# Patient Record
Sex: Female | Born: 1975
Health system: Southern US, Community
[De-identification: ages and names within clinical notes are randomized; demographics above are authoritative.]

## PROBLEM LIST (undated history)

## (undated) DIAGNOSIS — Z9889 Other specified postprocedural states: Secondary | ICD-10-CM

## (undated) DIAGNOSIS — F32A Depression, unspecified: Secondary | ICD-10-CM

## (undated) DIAGNOSIS — N201 Calculus of ureter: Secondary | ICD-10-CM

## (undated) DIAGNOSIS — N926 Irregular menstruation, unspecified: Secondary | ICD-10-CM

## (undated) DIAGNOSIS — F329 Major depressive disorder, single episode, unspecified: Secondary | ICD-10-CM

## (undated) DIAGNOSIS — R059 Cough, unspecified: Secondary | ICD-10-CM

## (undated) DIAGNOSIS — R112 Nausea with vomiting, unspecified: Secondary | ICD-10-CM

## (undated) DIAGNOSIS — Z87442 Personal history of urinary calculi: Secondary | ICD-10-CM

## (undated) DIAGNOSIS — R05 Cough: Secondary | ICD-10-CM

## (undated) DIAGNOSIS — K589 Irritable bowel syndrome without diarrhea: Secondary | ICD-10-CM

## (undated) DIAGNOSIS — K219 Gastro-esophageal reflux disease without esophagitis: Secondary | ICD-10-CM

## (undated) HISTORY — PX: STRABISMUS SURGERY: SHX218

## (undated) HISTORY — DX: Depression, unspecified: F32.A

## (undated) HISTORY — DX: Major depressive disorder, single episode, unspecified: F32.9

---

## 1996-01-29 HISTORY — PX: CHOLECYSTECTOMY: SHX55

## 1996-01-29 HISTORY — PX: OTHER SURGICAL HISTORY: SHX169

## 2002-04-09 ENCOUNTER — Other Ambulatory Visit: Admission: RE | Admit: 2002-04-09 | Discharge: 2002-04-09 | Payer: Self-pay | Admitting: Obstetrics and Gynecology

## 2002-07-14 ENCOUNTER — Emergency Department (HOSPITAL_COMMUNITY): Admission: EM | Admit: 2002-07-14 | Discharge: 2002-07-14 | Payer: Self-pay | Admitting: Emergency Medicine

## 2004-11-28 ENCOUNTER — Other Ambulatory Visit: Admission: RE | Admit: 2004-11-28 | Discharge: 2004-11-28 | Payer: Self-pay | Admitting: Obstetrics and Gynecology

## 2005-06-03 ENCOUNTER — Encounter: Admission: RE | Admit: 2005-06-03 | Discharge: 2005-06-03 | Payer: Self-pay | Admitting: Obstetrics and Gynecology

## 2005-08-11 ENCOUNTER — Inpatient Hospital Stay (HOSPITAL_COMMUNITY): Admission: AD | Admit: 2005-08-11 | Discharge: 2005-08-13 | Payer: Self-pay | Admitting: Obstetrics and Gynecology

## 2005-09-28 HISTORY — PX: ESSURE TUBAL LIGATION: SUR464

## 2006-01-14 ENCOUNTER — Ambulatory Visit (HOSPITAL_COMMUNITY): Admission: RE | Admit: 2006-01-14 | Discharge: 2006-01-14 | Payer: Self-pay | Admitting: Obstetrics and Gynecology

## 2006-04-03 ENCOUNTER — Ambulatory Visit: Payer: Self-pay | Admitting: Family Medicine

## 2011-05-13 ENCOUNTER — Emergency Department (HOSPITAL_BASED_OUTPATIENT_CLINIC_OR_DEPARTMENT_OTHER)
Admission: EM | Admit: 2011-05-13 | Discharge: 2011-05-13 | Disposition: A | Discharge status: Home / Self Care | Payer: 59 | Attending: Emergency Medicine | Admitting: Emergency Medicine

## 2011-05-13 ENCOUNTER — Emergency Department (INDEPENDENT_AMBULATORY_CARE_PROVIDER_SITE_OTHER): Payer: 59

## 2011-05-13 ENCOUNTER — Encounter (HOSPITAL_BASED_OUTPATIENT_CLINIC_OR_DEPARTMENT_OTHER): Payer: Self-pay | Admitting: *Deleted

## 2011-05-13 DIAGNOSIS — N1330 Unspecified hydronephrosis: Secondary | ICD-10-CM

## 2011-05-13 DIAGNOSIS — N201 Calculus of ureter: Secondary | ICD-10-CM

## 2011-05-13 DIAGNOSIS — R109 Unspecified abdominal pain: Secondary | ICD-10-CM | POA: Insufficient documentation

## 2011-05-13 DIAGNOSIS — N132 Hydronephrosis with renal and ureteral calculous obstruction: Secondary | ICD-10-CM

## 2011-05-13 HISTORY — DX: Irritable bowel syndrome, unspecified: K58.9

## 2011-05-13 LAB — URINALYSIS, ROUTINE W REFLEX MICROSCOPIC
Ketones, ur: NEGATIVE mg/dL
Specific Gravity, Urine: 1.022 (ref 1.005–1.030)
Urobilinogen, UA: 0.2 mg/dL (ref 0.0–1.0)
pH: 6.5 (ref 5.0–8.0)

## 2011-05-13 LAB — URINE MICROSCOPIC-ADD ON

## 2011-05-13 MED ORDER — ONDANSETRON HCL 4 MG/2ML IJ SOLN
4.0000 mg | Freq: Once | INTRAMUSCULAR | Status: AC
  Administered 2011-05-13: 4 mg via INTRAVENOUS
  Filled 2011-05-13: qty 2

## 2011-05-13 MED ORDER — NAPROXEN SODIUM 220 MG PO TABS: ORAL_TABLET | ORAL | Status: DC

## 2011-05-13 MED ORDER — FENTANYL CITRATE 0.05 MG/ML IJ SOLN
50.0000 ug | Freq: Once | INTRAMUSCULAR | Status: AC
  Administered 2011-05-13: 50 ug via INTRAVENOUS
  Filled 2011-05-13: qty 2

## 2011-05-13 MED ORDER — SODIUM CHLORIDE 0.9 % IV SOLN
INTRAVENOUS | Status: DC
  Administered 2011-05-13: 04:00:00 via INTRAVENOUS

## 2011-05-13 MED ORDER — ONDANSETRON 8 MG PO TBDP: 8.0000 mg | ORAL_TABLET | Freq: Three times a day (TID) | ORAL | Status: AC | PRN

## 2011-05-13 MED ORDER — HYDROMORPHONE HCL 2 MG PO TABS: 2.0000 mg | ORAL_TABLET | ORAL | Status: AC | PRN

## 2011-05-13 MED ORDER — KETOROLAC TROMETHAMINE 15 MG/ML IJ SOLN
15.0000 mg | Freq: Once | INTRAMUSCULAR | Status: AC
  Administered 2011-05-13: 15 mg via INTRAVENOUS
  Filled 2011-05-13: qty 1

## 2011-05-13 NOTE — ED Notes (Signed)
Pt reports RLQ pain since 1730 yesterday with nausea

## 2011-05-13 NOTE — ED Provider Notes (Signed)
History     CSN: [id number]  Arrival date & time 05/13/11  0207   First MD Initiated Contact with Patient 05/13/11 0250      Chief Complaint  Patient presents with  . Right flank pain     (Consider location/radiation/quality/duration/timing/severity/associated sxs/prior treatment) HPI This is a 36 year old white female with a history of irritable bowel syndrome and remote history of kidney stones. She is here with right flank pain that began yesterday evening. It radiates to the right lower quadrant. It is moderate in severity. It has not been relieved by taking hyoscyamine. The pain is moderate. She cannot find a comfortable position. She's had nausea but no vomiting. She's not aware of any hematuria or dysuria. She is having vaginal spotting right now.  Past Medical History  Diagnosis Date  . IBS (irritable bowel syndrome)   . Kidney stones     Past Surgical History  Procedure Date  . Cholecystectomy   . Lithotripsy   . Essure tubal ligation     No family history on file.  History  Substance Use Topics  . Smoking status: Never Smoker   . Smokeless tobacco: Never Used  . Alcohol Use: Not on file    OB History    Grav Para Term Preterm Abortions TAB SAB Ect Mult Living                  Review of Systems  All other systems reviewed and are negative.    Allergies  Penicillins  Home Medications   Current Outpatient Rx  Name Route Sig Dispense Refill  . EASY-LAX PO Oral Take by mouth.    Marland Kitchen HYOSCYAMINE SULFATE 0.125 MG PO TABS Oral Take 0.125 mg by mouth every 4 (four) hours as needed.      BP 113/68  Pulse 79  Temp(Src) 98.6 F (37 C) (Oral)  Resp 18  SpO2 98%  LMP 05/11/2011  Physical Exam General: Well-developed, well-nourished female in no acute distress; appearance consistent with age of record HENT: normocephalic, atraumatic Eyes: pupils equal round and reactive to light; extraocular muscles intact Neck: supple Heart: regular rate and  rhythm Lungs: clear to auscultation bilaterally Abdomen: soft; nondistended; nontender; no masses or hepatosplenomegaly; bowel sounds present GU: Mild right flank tenderness Extremities: No deformity; full range of motion Neurologic: Awake, alert and oriented; motor function intact in all extremities and symmetric; no facial droop Skin: Warm and dry Psychiatric: Mildly agitated    ED Course  Procedures (including critical care time)     MDM   Nursing notes and vitals signs, including pulse oximetry, reviewed.  Summary of this visit's results, reviewed by myself:  Labs:  Results for orders placed during the hospital encounter of 05/13/11  URINALYSIS, ROUTINE W REFLEX MICROSCOPIC      Component Value Range   Color, Urine YELLOW  YELLOW    APPearance CLEAR  CLEAR    Specific Gravity, Urine 1.022  1.005 - 1.030    pH 6.5  5.0 - 8.0    Glucose, UA NEGATIVE  NEGATIVE (mg/dL)   Hgb urine dipstick LARGE (*) NEGATIVE    Bilirubin Urine NEGATIVE  NEGATIVE    Ketones, ur NEGATIVE  NEGATIVE (mg/dL)   Protein, ur NEGATIVE  NEGATIVE (mg/dL)   Urobilinogen, UA 0.2  0.0 - 1.0 (mg/dL)   Nitrite NEGATIVE  NEGATIVE    Leukocytes, UA NEGATIVE  NEGATIVE   PREGNANCY, URINE      Component Value Range   Preg Test, Ur NEGATIVE  NEGATIVE   URINE MICROSCOPIC-ADD ON      Component Value Range   Squamous Epithelial / LPF RARE  RARE    WBC, UA 0-2  <3 (WBC/hpf)   RBC / HPF 11-20  <3 (RBC/hpf)   Bacteria, UA FEW (*) RARE    Urine-Other MUCOUS PRESENT      Imaging Studies: Ct Abdomen Pelvis Wo Contrast  05/13/2011  *RADIOLOGY REPORT*  Clinical Data: Right flank pain; history of renal stones.  CT ABDOMEN AND PELVIS WITHOUT CONTRAST  Technique:  Multidetector CT imaging of the abdomen and pelvis was performed following the standard protocol without intravenous contrast.  Comparison: None.  Findings: The visualized lung bases are clear.  The liver and spleen are unremarkable in appearance.  The  patient is status post cholecystectomy, with clips noted along the gallbladder fossa.  The pancreas and adrenal glands are unremarkable.  There is relatively severe right-sided hydronephrosis, with right- sided perinephric stranding and fluid, and prominence of the right ureter to the level of an obstructing 1.0 x 0.7 cm stone at the distal right ureter, perhaps 6 cm proximal to the right vesicoureteral junction.  An adjacent smaller 0.3 cm stone is noted more proximally.  The left kidney is unremarkable in appearance.  No nonobstructing renal stones are identified.  No free fluid is identified.  The small bowel is unremarkable in appearance.  The stomach is within normal limits.  No acute vascular abnormalities are seen.  The appendix is not definitely seen; there is no evidence for appendicitis.  The cecum is flipped anteriorly.  The colon is grossly unremarkable in appearance.  The bladder is mildly distended and grossly unremarkable in appearance.  The uterus is grossly unremarkable.  The patient's Essure occlusion device has migrated centrally on the left side, but is noted in grossly appropriate position on the right.  The ovaries are grossly unremarkable in appearance; no suspicious adnexal masses are seen.  No inguinal lymphadenopathy is seen.  No acute osseous abnormalities are identified.  IMPRESSION:  1.  Relatively severe right-sided hydronephrosis, with right-sided perinephric stranding and fluid, and an obstructing 1.0 x 0.7 cm stone noted distally in the right ureter, 6 cm proximal to the right vesicoureteral junction.  Adjacent smaller 0.3 cm stone noted more proximally in the right ureter. 2.  Essure occlusion device has migrated centrally on the left side into the uterus, though still seen in grossly appropriate position on the right.  Suggest clinical correlation.  Original Report Authenticated By: Tonia Ghent, M.D.    5:05 AM Patient's pain controlled with IV medications. Patient was  advised of stones seen on CT. She was also advised of the findings regarding her Essure occlusion device. She was advised to followup with her OB/GYN regarding the Essure device. She was advised to call [facility] today and arrange prompt followup. She was advised that surgical intervention is likely required in this case.       Hanley Seamen, MD 05/13/11 [phone]

## 2011-05-13 NOTE — Discharge Instructions (Signed)
Ureteral Colic (Kidney Stones) Ureteral colic is the result of a condition when kidney stones form inside the kidney. Once kidney stones are formed they may move into the tube that connects the kidney with the bladder (ureter). If this occurs, this condition may cause pain (colic) in the ureter.  CAUSES  Pain is caused by stone movement in the ureter and the obstruction caused by the stone. SYMPTOMS  The pain comes and goes as the ureter contracts around the stone. The pain is usually intense, sharp, and stabbing in character. The location of the pain may move as the stone moves through the ureter. When the stone is near the kidney the pain is usually located in the back and radiates to the belly (abdomen). When the stone is ready to pass into the bladder the pain is often located in the lower abdomen on the side the stone is located. At this location, the symptoms may mimic those of a urinary tract infection with urinary frequency. Once the stone is located here it often passes into the bladder and the pain disappears completely. TREATMENT   Your caregiver will provide you with medicine for pain relief.   You may require specialized follow-up X-rays.   The absence of pain does not always mean that the stone has passed. It may have just stopped moving. If the urine remains completely obstructed, it can cause loss of kidney function or even complete destruction of the involved kidney. It is your responsibility and in your interest that X-rays and follow-ups as suggested by your caregiver are completed. Relief of pain without passage of the stone can be associated with severe damage to the kidney, including loss of kidney function on that side.   If your stone does not pass on its own, additional measures may be taken by your caregiver to ensure its removal.  HOME CARE INSTRUCTIONS   Increase your fluid intake. Water is the preferred fluid since juices containing vitamin C may acidify the urine making  it less likely for certain stones (uric acid stones) to pass.   Strain all urine. A strainer will be provided. Keep all particulate matter or stones for your caregiver to inspect.   Take your pain medicine as directed.   Make a follow-up appointment with your caregiver as directed.   Remember that the goal is passage of your stone. The absence of pain does not mean the stone is gone. Follow your caregiver's instructions.   Only take over-the-counter or prescription medicines for pain, discomfort, or fever as directed by your caregiver.  SEEK MEDICAL CARE IF:   Pain cannot be controlled with the prescribed medicine.   You have a fever.   Pain continues for longer than your caregiver advises it should.   There is a change in the pain, and you develop chest discomfort or constant abdominal pain.   You feel faint or pass out.  MAKE SURE YOU:   Understand these instructions.   Will watch your condition.   Will get help right away if you are not doing well or get worse.  Document Released: 10/24/2004 Document Revised: 01/03/2011 Document Reviewed: 07/11/2010 [employer] Patient Information 2012 [employer]. 

## 2011-05-14 ENCOUNTER — Other Ambulatory Visit: Payer: Self-pay | Admitting: Urology

## 2011-05-16 ENCOUNTER — Encounter (HOSPITAL_BASED_OUTPATIENT_CLINIC_OR_DEPARTMENT_OTHER): Payer: Self-pay | Admitting: *Deleted

## 2011-05-16 NOTE — Progress Notes (Signed)
NPO AFTER MN. ARRIVES AT 0915. NEEDS HG. MAY TAKE PRN MEDS (NO ALEVE), IF NEEDED W/ SIPS OF WATER AM OF SURG.

## 2011-05-17 NOTE — H&P (Signed)
Ms. Kimberly Riley presents today as a referral from [facility] for a new diagnosis of right-sided ureteral calculi. Ms. Kimberly Riley is 36 years of age. She reports an episode of nephrolithiasis about 15 years ago. Apparently she had multiple stones at that time and had a procedure that sounds like a probable ureteroscopy. She has had no recurrences. Approximately two days ago she developed some right-sided abdominal pain which she first thought was her irritable syndrome. The pain persisted. She was seen in the emergency room. There she had a urinalysis which did show microhematuria. Urine pH was 6.5. The patient had a CT scan which showed right-sided hydronephrosis. She had an obstructing 10 x 7 mm stone in her distal right ureter. They thought there was an adjacent smaller 3 mm stone just proximal to that. The left kidney was unremarkable. There were no other stones identified.       Past Medical History Problems  1. History of  Depression 311 2. History of  Irritable Bowel Syndrome 564.1  Surgical History Problems  1. History of  Cholecystectomy 2. History of  Eye Surgery 3. History of  Foot Surgery  Current Meds 1. Hyoscyamine Sulfate 0.125 MG Oral Tablet; Therapy: (Recorded:16Apr2013) to  Allergies Medication  1. Penicillins  Family History Problems  1. Paternal history of  Family Health Status - Father's Age 14yrs 2. Maternal history of  Family Health Status - Mother's Age 55yrs 3. Family history of  Family Health Status Number Of Children 2 sons 4. 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  Gastrointestinal: nausea, flank pain, abdominal pain and diarrhea.  Constitutional: feeling tired (fatigue).  Psychiatric: depression.    Vitals Vital Signs [Data Includes: Last 1 Day]  16Apr2013 03:01PM  BMI Calculated: 34.15 BSA Calculated: 1.96 Height: 5 ft 4 in Weight: 200 lb  Blood Pressure: 109 / 76 Temperature: 98.1 F Heart Rate: 107  Physical Exam Constitutional: Well nourished and well developed . No acute distress.  ENT:. The ears and nose are normal in appearance.  Neck: The appearance of the neck is normal and no neck mass is present . There is no jugular-venous distention.  Pulmonary: No respiratory distress and normal respiratory rhythm and effort.  Cardiovascular: Heart rate and rhythm are normal . No peripheral edema.  Abdomen: The abdomen is soft and nontender. No masses are palpated. No CVA tenderness.  Skin: Normal skin turgor, no visible rash and no visible skin lesions.  Neuro/Psych:. Mood and affect are appropriate.    Results/Data Urine [Data Includes: Last 1 Day]   16Apr2013  COLOR AMBER   APPEARANCE CLEAR   SPECIFIC GRAVITY 1.030   pH 6.0   GLUCOSE NEG mg/dL  BILIRUBIN SMALL   KETONE 15 mg/dL  BLOOD LARGE   PROTEIN 30 mg/dL  UROBILINOGEN 0.2 mg/dL  NITRITE NEG   LEUKOCYTE ESTERASE TRACE   SQUAMOUS EPITHELIAL/HPF FEW   WBC 7-10 WBC/hpf  RBC 21-50 RBC/hpf  BACTERIA FEW   CRYSTALS NONE SEEN   CASTS NONE SEEN   Other SM. AMT. MUCUS OBS.     KUB was obtained. This does  demonstrate a very suspicious 10 x 7 mm calcification in the right hemi-pelvis just past the pelvic brim. There is an additional 3 mm calcification as well. The patient has Essure____ devices bilaterally. They are somewhat asymmetric and apparently there was some question as to whether one is malpositioned or not. I do not see any evidence of any renal calculi.    Assessment Assessed  1. Ureteral Stone  592.1 2. Pyuria 791.9  Plan Health Maintenance (V70.0)  1. UA With REFLEX  Done: 16Apr2013 02:38PM  Discussion/Summary   Ms. Kimberly Riley has a very large 7 x 10 mm stone in her right distal ureter approximately 5-6 cm above her right ureterovesical junction. Clinically she is doing okay and there is no urgent need for intervention. If she had to have anything done more urgently it would be just a double-J stent and I would rather try to get her on the schedule for more definitive management. Lithotripsy is an option but I do think she would be better served with ureteroscopy. She has had that procedure in the past. We would require Holmium laser lithotripsy and a high likelihood of a double-J stent at least for 4-5 days. This would, however, have the best chance of success and that procedure, recovery risks, benefits are discussed with her today. She will need to check with her OB/GYN about her birth control device and whether any additional assessment needs to be done with regard to that.

## 2011-05-20 ENCOUNTER — Encounter (HOSPITAL_BASED_OUTPATIENT_CLINIC_OR_DEPARTMENT_OTHER): Payer: Self-pay | Admitting: *Deleted

## 2011-05-20 ENCOUNTER — Encounter (HOSPITAL_BASED_OUTPATIENT_CLINIC_OR_DEPARTMENT_OTHER): Payer: Self-pay | Admitting: Anesthesiology

## 2011-05-20 ENCOUNTER — Ambulatory Visit (HOSPITAL_BASED_OUTPATIENT_CLINIC_OR_DEPARTMENT_OTHER)
Admission: RE | Admit: 2011-05-20 | Discharge: 2011-05-20 | Disposition: A | Discharge status: Home / Self Care | Payer: 59 | Source: Ambulatory Visit | Attending: Urology | Admitting: Urology

## 2011-05-20 ENCOUNTER — Encounter (HOSPITAL_BASED_OUTPATIENT_CLINIC_OR_DEPARTMENT_OTHER)
Admission: RE | Disposition: A | Discharge status: Home / Self Care | Payer: Self-pay | Source: Ambulatory Visit | Attending: Urology

## 2011-05-20 ENCOUNTER — Ambulatory Visit (HOSPITAL_BASED_OUTPATIENT_CLINIC_OR_DEPARTMENT_OTHER): Payer: 59 | Admitting: Anesthesiology

## 2011-05-20 DIAGNOSIS — Z79899 Other long term (current) drug therapy: Secondary | ICD-10-CM | POA: Insufficient documentation

## 2011-05-20 DIAGNOSIS — R82998 Other abnormal findings in urine: Secondary | ICD-10-CM | POA: Insufficient documentation

## 2011-05-20 DIAGNOSIS — N201 Calculus of ureter: Secondary | ICD-10-CM | POA: Insufficient documentation

## 2011-05-20 DIAGNOSIS — K589 Irritable bowel syndrome without diarrhea: Secondary | ICD-10-CM | POA: Insufficient documentation

## 2011-05-20 HISTORY — DX: Personal history of urinary calculi: Z87.442

## 2011-05-20 HISTORY — PX: OTHER SURGICAL HISTORY: SHX169

## 2011-05-20 SURGERY — CYSTOURETEROSCOPY, WITH RETROGRADE PYELOGRAM AND STENT INSERTION
Anesthesia: General | Site: Ureter | Wound class: Clean Contaminated

## 2011-05-20 MED ORDER — DEXAMETHASONE SODIUM PHOSPHATE 4 MG/ML IJ SOLN
INTRAMUSCULAR | Status: DC | PRN
  Administered 2011-05-20: 10 mg via INTRAVENOUS

## 2011-05-20 MED ORDER — URELLE 81 MG PO TABS
1.0000 | ORAL_TABLET | Freq: Once | ORAL | Status: AC
  Administered 2011-05-20: 81 mg via ORAL

## 2011-05-20 MED ORDER — PROPOFOL 10 MG/ML IV EMUL
INTRAVENOUS | Status: DC | PRN
  Administered 2011-05-20: 200 mg via INTRAVENOUS

## 2011-05-20 MED ORDER — LACTATED RINGERS IV SOLN: INTRAVENOUS | Status: DC

## 2011-05-20 MED ORDER — ONDANSETRON HCL 4 MG/2ML IJ SOLN
INTRAMUSCULAR | Status: DC | PRN
  Administered 2011-05-20: 4 mg via INTRAVENOUS

## 2011-05-20 MED ORDER — HYDROMORPHONE HCL 2 MG PO TABS
2.0000 mg | ORAL_TABLET | Freq: Once | ORAL | Status: AC
  Administered 2011-05-20: 2 mg via ORAL

## 2011-05-20 MED ORDER — CIPROFLOXACIN IN D5W 400 MG/200ML IV SOLN
400.0000 mg | INTRAVENOUS | Status: AC
  Administered 2011-05-20: 400 mg via INTRAVENOUS

## 2011-05-20 MED ORDER — FENTANYL CITRATE 0.05 MG/ML IJ SOLN
INTRAMUSCULAR | Status: DC | PRN
  Administered 2011-05-20: 50 ug via INTRAVENOUS
  Administered 2011-05-20 (×4): 25 ug via INTRAVENOUS
  Administered 2011-05-20: 50 ug via INTRAVENOUS

## 2011-05-20 MED ORDER — SODIUM CHLORIDE 0.9 % IR SOLN
Status: DC | PRN
  Administered 2011-05-20: 6000 mL

## 2011-05-20 MED ORDER — LIDOCAINE HCL (CARDIAC) 20 MG/ML IV SOLN
INTRAVENOUS | Status: DC | PRN
  Administered 2011-05-20: 50 mg via INTRAVENOUS

## 2011-05-20 MED ORDER — BELLADONNA ALKALOIDS-OPIUM 16.2-60 MG RE SUPP
RECTAL | Status: DC | PRN
  Administered 2011-05-20: 1 via RECTAL

## 2011-05-20 MED ORDER — LIDOCAINE HCL 2 % EX GEL
CUTANEOUS | Status: DC | PRN
  Administered 2011-05-20: 1 via URETHRAL

## 2011-05-20 MED ORDER — LACTATED RINGERS IV SOLN
INTRAVENOUS | Status: DC
  Administered 2011-05-20 (×3): via INTRAVENOUS

## 2011-05-20 MED ORDER — PROMETHAZINE HCL 25 MG/ML IJ SOLN: 6.2500 mg | INTRAMUSCULAR | Status: DC | PRN

## 2011-05-20 MED ORDER — URIBEL 118 MG PO CAPS: 1.0000 | ORAL_CAPSULE | Freq: Three times a day (TID) | ORAL | Status: DC | PRN

## 2011-05-20 MED ORDER — FENTANYL CITRATE 0.05 MG/ML IJ SOLN: 25.0000 ug | INTRAMUSCULAR | Status: DC | PRN

## 2011-05-20 MED ORDER — MIDAZOLAM HCL 5 MG/5ML IJ SOLN
INTRAMUSCULAR | Status: DC | PRN
  Administered 2011-05-20: 1 mg via INTRAVENOUS

## 2011-05-20 MED ORDER — NITROFURANTOIN MONOHYD MACRO 100 MG PO CAPS: 100.0000 mg | ORAL_CAPSULE | Freq: Every day | ORAL | Status: AC

## 2011-05-20 MED ORDER — IOHEXOL 350 MG/ML SOLN
INTRAVENOUS | Status: DC | PRN
  Administered 2011-05-20: 50 mL via INTRAVENOUS

## 2011-05-20 SURGICAL SUPPLY — 40 items
ADAPTER CATH URET PLST 4-6FR (CATHETERS) IMPLANT
ADPR CATH URET STRL DISP 4-6FR (CATHETERS)
APL SKNCLS STERI-STRIP NONHPOA (GAUZE/BANDAGES/DRESSINGS) ×1
BAG DRAIN URO-CYSTO SKYTR STRL (DRAIN) ×2 IMPLANT
BAG DRN UROCATH (DRAIN) ×1
BASKET LASER NITINOL 1.9FR (BASKET) IMPLANT
BASKET STNLS GEMINI 4WIRE 3FR (BASKET) IMPLANT
BASKET ZERO TIP NITINOL 2.4FR (BASKET) ×3 IMPLANT
BENZOIN TINCTURE PRP APPL 2/3 (GAUZE/BANDAGES/DRESSINGS) ×1 IMPLANT
BSKT STON RTRVL 120 1.9FR (BASKET)
BSKT STON RTRVL GEM 120X11 3FR (BASKET)
BSKT STON RTRVL ZERO TP 2.4FR (BASKET) ×2
CANISTER SUCT LVC 12 LTR MEDI- (MISCELLANEOUS) IMPLANT
CATH INTERMIT  6FR 70CM (CATHETERS) ×1 IMPLANT
CATH URET 5FR 28IN CONE TIP (BALLOONS)
CATH URET 5FR 28IN OPEN ENDED (CATHETERS) IMPLANT
CATH URET 5FR 70CM CONE TIP (BALLOONS) IMPLANT
CLOTH BEACON ORANGE TIMEOUT ST (SAFETY) ×2 IMPLANT
DRAPE CAMERA CLOSED 9X96 (DRAPES) ×2 IMPLANT
DRSG TEGADERM 2-3/8X2-3/4 SM (GAUZE/BANDAGES/DRESSINGS) ×1 IMPLANT
GLOVE BIO SURGEON STRL SZ7.5 (GLOVE) ×2 IMPLANT
GLOVE ECLIPSE 6.0 STRL STRAW (GLOVE) ×1 IMPLANT
GLOVE SS BIOGEL STRL SZ 6.5 (GLOVE) IMPLANT
GLOVE SUPERSENSE BIOGEL SZ 6.5 (GLOVE) ×1
GOWN STRL REIN XL XLG (GOWN DISPOSABLE) ×2 IMPLANT
GUIDEWIRE 0.038 PTFE COATED (WIRE) IMPLANT
GUIDEWIRE ANG ZIPWIRE 038X150 (WIRE) IMPLANT
GUIDEWIRE STR DUAL SENSOR (WIRE) IMPLANT
IV NS IRRIG 3000ML ARTHROMATIC (IV SOLUTION) ×4 IMPLANT
KIT BALLIN UROMAX 15FX10 (LABEL) IMPLANT
KIT BALLN UROMAX 15FX4 (MISCELLANEOUS) IMPLANT
KIT BALLN UROMAX 26 75X4 (MISCELLANEOUS)
LASER FIBER DISP (UROLOGICAL SUPPLIES) ×1 IMPLANT
LASER FIBER DISP 1000U (UROLOGICAL SUPPLIES) IMPLANT
NS IRRIG 500ML POUR BTL (IV SOLUTION) IMPLANT
PACK CYSTOSCOPY (CUSTOM PROCEDURE TRAY) ×2 IMPLANT
SET HIGH PRES BAL DIL (LABEL)
SHEATH URET ACCESS 12FR/35CM (UROLOGICAL SUPPLIES) IMPLANT
SHEATH URET ACCESS 12FR/55CM (UROLOGICAL SUPPLIES) IMPLANT
STENT URET 6FRX24 CONTOUR (STENTS) ×1 IMPLANT

## 2011-05-20 NOTE — Anesthesia Postprocedure Evaluation (Signed)
Anesthesia Post Note  Patient: Kimberly Riley  Procedure(s) Performed: Procedure(s) (LRB): CYSTOSCOPY WITH RETROGRADE PYELOGRAM, URETEROSCOPY AND STENT PLACEMENT (N/A)  Anesthesia type: General  Patient location: PACU  Post pain: Pain level controlled  Post assessment: Post-op Vital signs reviewed  Last Vitals:  Filed Vitals:   05/20/11 1145  BP: 103/68  Pulse: 84  Temp:   Resp: 19    Post vital signs: Reviewed  Level of consciousness: sedated  Complications: No apparent anesthesia complications

## 2011-05-20 NOTE — Anesthesia Preprocedure Evaluation (Addendum)
Anesthesia Evaluation  Patient identified by MRN, date of birth, ID band Patient awake    Reviewed: Allergy & Precautions, H&P , NPO status , Patient's Chart, lab work & pertinent test results  Airway Mallampati: II TM Distance: >3 FB Neck ROM: Full    Dental No notable dental hx. (+) Teeth Intact and Dental Advisory Given   Pulmonary neg pulmonary ROS,  breath sounds clear to auscultation  Pulmonary exam normal       Cardiovascular negative cardio ROS  Rhythm:Regular Rate:Normal     Neuro/Psych negative neurological ROS  negative psych ROS   GI/Hepatic negative GI ROS, Neg liver ROS,   Endo/Other  negative endocrine ROS  Renal/GU negative Renal ROS  negative genitourinary   Musculoskeletal negative musculoskeletal ROS (+)   Abdominal   Peds negative pediatric ROS (+)  Hematology negative hematology ROS (+)   Anesthesia Other Findings   Reproductive/Obstetrics negative OB ROS                          Anesthesia Physical Anesthesia Plan  ASA: II  Anesthesia Plan: General   Post-op Pain Management:    Induction: Intravenous  Airway Management Planned: LMA  Additional Equipment:   Intra-op Plan:   Post-operative Plan: Extubation in OR  Informed Consent: I have reviewed the patients History and Physical, chart, labs and discussed the procedure including the risks, benefits and alternatives for the proposed anesthesia with the patient or authorized representative who has indicated his/her understanding and acceptance.   Dental advisory given  Plan Discussed with: CRNA  Anesthesia Plan Comments:         Anesthesia Quick Evaluation

## 2011-05-20 NOTE — Op Note (Signed)
Preoperative diagnosis: 10 mm right ureteral calculus  Postoperative diagnosis: Name ureteral calculus  Procedure:  1. Cystoscopy 2. Right ureteroscopy and stone removal 3. Ureteroscopic laser lithotripsy 4. Right ureteral stent placement (6 [place]) 24 cm with dangle string 5. Right retrograde pyelography with interpretation  Surgeon: Valetta Fuller, MD  Anesthesia: General  Complications: None  Intraoperative findings: Right retrograde pyelography demonstrated a filling defect within the right distal ureter ureter consistent with the patient's known calculus without other abnormalities.  EBL: Minimal  Specimens: 1. Right ureteral calculus  Disposition of specimens: [facility] for stone analysis  Indication: Kimberly Riley is a 36 y.o.   patient with urolithiasis. After reviewing the management options for treatment, the patient elected to proceed with the above surgical procedure(s). We have discussed the potential benefits and risks of the procedure, side effects of the proposed treatment, the likelihood of the patient achieving the goals of the procedure, and any potential problems that might occur during the procedure or recuperation. Informed consent has been obtained.  Description of procedure:  The patient was taken to the operating room and general anesthesia was induced.  The patient was placed in the dorsal lithotomy position, prepped and draped in the usual sterile fashion, and preoperative antibiotics were administered. A preoperative time-out was performed.   Cystourethroscopy was performed.  The bladder was then systematically examined in its entirety. There was no evidence for any bladder tumors, stones, or other mucosal pathology.    Attention then turned to the right ureteral orifice and a ureteral catheter was used to intubate the ureteral orifice.  Omnipaque contrast was injected through the ureteral catheter and a retrograde pyelogram was  performed with findings as dictated above.  A 0.38 sensor guidewire was then advanced up the right ureter into the renal pelvis under fluoroscopic guidance. The 6 Fr semirigid ureteroscope was then advanced into the ureter next to the guidewire and the calculus was identified.   The stone was then fragmented with the 365 micron holmium laser fiber on a setting of 0.5 J and frequency of 20 Hz.   All stones were then removed from the ureter with a zero tip nitinol basket.  Reinspection of the ureter revealed no remaining visible stones or fragments.   The wire was then backloaded through the cystoscope and a ureteral stent was advance over the wire using Seldinger technique.  The stent was positioned appropriately under fluoroscopic and cystoscopic guidance.  The wire was then removed with an adequate stent curl noted in the renal pelvis as well as in the bladder.  The bladder was then emptied and the procedure ended.  The patient appeared to tolerate the procedure well and without complications.  The patient was able to be awakened and transferred to the recovery unit in satisfactory condition.

## 2011-05-20 NOTE — Interval H&P Note (Signed)
History and Physical Interval Note:  05/20/2011 10:23 AM  Kimberly Riley  has presented today for surgery, with the diagnosis of right ureteral calculus  The various methods of treatment have been discussed with the patient and family. After consideration of risks, benefits and other options for treatment, the patient has consented to  Procedure(s) (LRB): CYSTOSCOPY WITH RETROGRADE PYELOGRAM, URETEROSCOPY AND STENT PLACEMENT (N/A) as a surgical intervention .  The patients' history has been reviewed, patient examined, no change in status, stable for surgery.  I have reviewed the patients' chart and labs.  Questions were answered to the patient's satisfaction.     Ammon Muscatello S

## 2011-05-20 NOTE — Transfer of Care (Signed)
Immediate Anesthesia Transfer of Care Note  Patient: MARCENE Riley  Procedure(s) Performed: Procedure(s) (LRB): CYSTOSCOPY WITH RETROGRADE PYELOGRAM, URETEROSCOPY AND STENT PLACEMENT (N/A)  Patient Location: Patient transported to PACU with oxygen via face mask at 4 Liters / Min  Anesthesia Type: General  Level of Consciousness: awake and alert   Airway & Oxygen Therapy: Patient Spontanous Breathing and Patient connected to face mask oxygen  Post-op Assessment: Report given to PACU RN and Post -op Vital signs reviewed and stable  Post vital signs: Reviewed and stable  Dentition: Teeth and oropharynx remain in pre-op condition  Complications: No apparent anesthesia complications

## 2011-05-20 NOTE — Discharge Instructions (Signed)
DISCHARGE INSTRUCTIONS FOR KIDNEY STONES OR URETERAL STENT  MEDICATIONS:   1. DO NOT RESUME YOUR ASPIRIN, or any other medicines like ibuprofen, motrin, excedrin, advil, aleve, vitamin E, fish oil as these can all cause bleeding x 7 days.  2. Resume all your other meds from home - except do not take any other pain meds that you may have at home.  ACTIVITY 1. No strenuous activity x 1week 2. No driving while on narcotic pain medications 3. Drink plenty of water 4. Continue to walk at home - you can still get blood clots when you are at home, so keep active, but don't over do it. 5. May return to work in 3 days.  BATHING 1. You can shower and we recommend daily showers  2. If you have a string coming from your urethra:  The stent string is attached to your ureteral stent.  Do not pull on this.   SIGNS/SYMPTOMS TO CALL: 1. Please call us if you have a fever greater than 101.5, uncontrolled  nausea/vomiting, uncontrolled pain, dizziness, unable to urinate, bloody urine, chest pain, shortness of breath, leg swelling, leg pain, redness around wound, drainage from wound, or any other concerns or questions.  You can reach [place] at [phone].  FOLLOW-UP 1. You have an appointment 4-26 10 am 2. Also 5-10 10am 3.  4.    Post Anesthesia Home Care Instructions  Activity: Get plenty of rest for the remainder of the day. A responsible adult should stay with you for 24 hours following the procedure.  For the next 24 hours, DO NOT: -Drive a car -Advertising copywriter -Drink alcoholic beverages -Take any medication unless instructed by your physician -Make any legal decisions or sign important papers.  Meals: Start with liquid foods such as gelatin or soup. Progress to regular foods as tolerated. Avoid greasy, spicy, heavy foods. If nausea and/or vomiting occur, drink only clear liquids until the nausea and/or vomiting subsides. Call your physician if vomiting continues.  Special  Instructions/Symptoms: Your throat may feel dry or sore from the anesthesia or the breathing tube placed in your throat during surgery. If this causes discomfort, gargle with warm salt water. The discomfort should disappear within 24 hours. 5.

## 2011-05-20 NOTE — Anesthesia Procedure Notes (Signed)
Procedure Name: LMA Insertion Date/Time: 05/20/2011 10:33 AM Performed by: Fran Lowes Pre-anesthesia Checklist: Patient identified, Emergency Drugs available, Suction available and Patient being monitored Patient Re-evaluated:Patient Re-evaluated prior to inductionOxygen Delivery Method: Circle System Utilized Preoxygenation: Pre-oxygenation with 100% oxygen Intubation Type: IV induction Ventilation: Mask ventilation without difficulty LMA: LMA inserted LMA Size: 4.0 Number of attempts: 1 Airway Equipment and Method: bite block Placement Confirmation: positive ETCO2 Tube secured with: Tape Dental Injury: Teeth and Oropharynx as per pre-operative assessment

## 2013-04-16 ENCOUNTER — Ambulatory Visit (HOSPITAL_COMMUNITY)
Admission: RE | Admit: 2013-04-16 | Discharge: 2013-04-16 | Disposition: A | Discharge status: Home / Self Care | Payer: 59 | Source: Ambulatory Visit | Attending: Family Medicine | Admitting: Family Medicine

## 2013-04-16 ENCOUNTER — Other Ambulatory Visit (HOSPITAL_COMMUNITY): Payer: Self-pay | Admitting: Family Medicine

## 2013-04-16 DIAGNOSIS — R109 Unspecified abdominal pain: Secondary | ICD-10-CM

## 2013-04-16 DIAGNOSIS — Z975 Presence of (intrauterine) contraceptive device: Secondary | ICD-10-CM | POA: Insufficient documentation

## 2013-04-16 DIAGNOSIS — R319 Hematuria, unspecified: Secondary | ICD-10-CM | POA: Insufficient documentation

## 2013-04-16 DIAGNOSIS — N201 Calculus of ureter: Secondary | ICD-10-CM | POA: Insufficient documentation

## 2013-04-16 DIAGNOSIS — R110 Nausea: Secondary | ICD-10-CM | POA: Insufficient documentation

## 2013-04-16 DIAGNOSIS — N200 Calculus of kidney: Secondary | ICD-10-CM | POA: Insufficient documentation

## 2013-04-16 DIAGNOSIS — N1330 Unspecified hydronephrosis: Secondary | ICD-10-CM | POA: Insufficient documentation

## 2013-04-16 DIAGNOSIS — K439 Ventral hernia without obstruction or gangrene: Secondary | ICD-10-CM | POA: Insufficient documentation

## 2013-04-19 ENCOUNTER — Other Ambulatory Visit: Payer: Self-pay | Admitting: Urology

## 2013-04-19 ENCOUNTER — Encounter (HOSPITAL_BASED_OUTPATIENT_CLINIC_OR_DEPARTMENT_OTHER): Payer: Self-pay | Admitting: *Deleted

## 2013-04-19 NOTE — Progress Notes (Signed)
NPO AFTER MN. ARRIVE AT 1000. NEEDS HG .  MAY TAKE TRAMADOL AM DOS W/ SIPS OF WATER IF NEEDED.

## 2013-04-21 ENCOUNTER — Encounter (HOSPITAL_BASED_OUTPATIENT_CLINIC_OR_DEPARTMENT_OTHER): Payer: 59 | Admitting: Certified Registered"

## 2013-04-21 ENCOUNTER — Encounter (HOSPITAL_BASED_OUTPATIENT_CLINIC_OR_DEPARTMENT_OTHER): Payer: Self-pay | Admitting: *Deleted

## 2013-04-21 ENCOUNTER — Encounter (HOSPITAL_BASED_OUTPATIENT_CLINIC_OR_DEPARTMENT_OTHER)
Admission: RE | Disposition: A | Discharge status: Home / Self Care | Payer: Self-pay | Source: Ambulatory Visit | Attending: Urology

## 2013-04-21 ENCOUNTER — Ambulatory Visit (HOSPITAL_BASED_OUTPATIENT_CLINIC_OR_DEPARTMENT_OTHER): Payer: 59 | Admitting: Certified Registered"

## 2013-04-21 ENCOUNTER — Ambulatory Visit (HOSPITAL_BASED_OUTPATIENT_CLINIC_OR_DEPARTMENT_OTHER)
Admission: RE | Admit: 2013-04-21 | Discharge: 2013-04-21 | Disposition: A | Discharge status: Home / Self Care | Payer: 59 | Source: Ambulatory Visit | Attending: Urology | Admitting: Urology

## 2013-04-21 DIAGNOSIS — K219 Gastro-esophageal reflux disease without esophagitis: Secondary | ICD-10-CM | POA: Insufficient documentation

## 2013-04-21 DIAGNOSIS — Z880 Allergy status to penicillin: Secondary | ICD-10-CM | POA: Insufficient documentation

## 2013-04-21 DIAGNOSIS — K589 Irritable bowel syndrome without diarrhea: Secondary | ICD-10-CM | POA: Insufficient documentation

## 2013-04-21 DIAGNOSIS — N201 Calculus of ureter: Secondary | ICD-10-CM | POA: Insufficient documentation

## 2013-04-21 DIAGNOSIS — Z9089 Acquired absence of other organs: Secondary | ICD-10-CM | POA: Insufficient documentation

## 2013-04-21 HISTORY — DX: Gastro-esophageal reflux disease without esophagitis: K21.9

## 2013-04-21 HISTORY — PX: CYSTOSCOPY WITH RETROGRADE PYELOGRAM, URETEROSCOPY AND STENT PLACEMENT: SHX5789

## 2013-04-21 HISTORY — PX: HOLMIUM LASER APPLICATION: SHX5852

## 2013-04-21 LAB — POCT HEMOGLOBIN-HEMACUE: Hemoglobin: 13.6 g/dL (ref 12.0–15.0)

## 2013-04-21 SURGERY — CYSTOURETEROSCOPY, WITH RETROGRADE PYELOGRAM AND STENT INSERTION
Anesthesia: General | Site: Ureter | Laterality: Right

## 2013-04-21 MED ORDER — PROPOFOL 10 MG/ML IV BOLUS
INTRAVENOUS | Status: DC | PRN
  Administered 2013-04-21: 180 mg via INTRAVENOUS

## 2013-04-21 MED ORDER — BELLADONNA ALKALOIDS-OPIUM 16.2-60 MG RE SUPP
RECTAL | Status: DC | PRN
  Administered 2013-04-21: 1 via RECTAL

## 2013-04-21 MED ORDER — PROMETHAZINE HCL 25 MG/ML IJ SOLN
6.2500 mg | INTRAMUSCULAR | Status: DC | PRN
  Filled 2013-04-21: qty 1

## 2013-04-21 MED ORDER — OXYCODONE HCL 5 MG PO TABS
5.0000 mg | ORAL_TABLET | Freq: Once | ORAL | Status: DC | PRN
  Filled 2013-04-21: qty 1

## 2013-04-21 MED ORDER — URIBEL 118 MG PO CAPS: 1.0000 | ORAL_CAPSULE | Freq: Three times a day (TID) | ORAL | Status: DC | PRN

## 2013-04-21 MED ORDER — CIPROFLOXACIN IN D5W 400 MG/200ML IV SOLN
INTRAVENOUS | Status: AC
  Filled 2013-04-21: qty 200

## 2013-04-21 MED ORDER — FENTANYL CITRATE 0.05 MG/ML IJ SOLN
25.0000 ug | INTRAMUSCULAR | Status: DC | PRN
  Administered 2013-04-21 (×4): 25 ug via INTRAVENOUS
  Filled 2013-04-21: qty 1

## 2013-04-21 MED ORDER — OXYCODONE HCL 5 MG/5ML PO SOLN
5.0000 mg | Freq: Once | ORAL | Status: DC | PRN
  Filled 2013-04-21: qty 5

## 2013-04-21 MED ORDER — ONDANSETRON HCL 4 MG/2ML IJ SOLN
INTRAMUSCULAR | Status: DC | PRN
  Administered 2013-04-21: 4 mg via INTRAVENOUS

## 2013-04-21 MED ORDER — HYDROMORPHONE HCL PF 1 MG/ML IJ SOLN
INTRAMUSCULAR | Status: AC
  Filled 2013-04-21: qty 1

## 2013-04-21 MED ORDER — HYDROCODONE-ACETAMINOPHEN 5-325 MG PO TABS: 1.0000 | ORAL_TABLET | Freq: Four times a day (QID) | ORAL | Status: DC | PRN

## 2013-04-21 MED ORDER — MIDAZOLAM HCL 5 MG/5ML IJ SOLN
INTRAMUSCULAR | Status: DC | PRN
  Administered 2013-04-21: 2 mg via INTRAVENOUS

## 2013-04-21 MED ORDER — MEPERIDINE HCL 25 MG/ML IJ SOLN
6.2500 mg | INTRAMUSCULAR | Status: DC | PRN
  Filled 2013-04-21: qty 1

## 2013-04-21 MED ORDER — DEXAMETHASONE SODIUM PHOSPHATE 4 MG/ML IJ SOLN
INTRAMUSCULAR | Status: DC | PRN
  Administered 2013-04-21: 10 mg via INTRAVENOUS

## 2013-04-21 MED ORDER — FENTANYL CITRATE 0.05 MG/ML IJ SOLN
INTRAMUSCULAR | Status: AC
Start: 2013-04-21 — End: 2013-04-21
  Filled 2013-04-21: qty 2

## 2013-04-21 MED ORDER — SODIUM CHLORIDE 0.9 % IR SOLN
Status: DC | PRN
  Administered 2013-04-21: 2000 mL

## 2013-04-21 MED ORDER — MIDAZOLAM HCL 2 MG/2ML IJ SOLN
INTRAMUSCULAR | Status: AC
  Filled 2013-04-21: qty 2

## 2013-04-21 MED ORDER — LIDOCAINE HCL 2 % EX GEL
CUTANEOUS | Status: DC | PRN
  Administered 2013-04-21: 1 via URETHRAL

## 2013-04-21 MED ORDER — HYDROMORPHONE HCL PF 1 MG/ML IJ SOLN
0.2500 mg | INTRAMUSCULAR | Status: DC | PRN
  Filled 2013-04-21: qty 1

## 2013-04-21 MED ORDER — IOHEXOL 350 MG/ML SOLN
INTRAVENOUS | Status: DC | PRN
  Administered 2013-04-21: 4 mL

## 2013-04-21 MED ORDER — CIPROFLOXACIN IN D5W 400 MG/200ML IV SOLN
400.0000 mg | INTRAVENOUS | Status: AC
Start: 2013-04-21 — End: 2013-04-21
  Administered 2013-04-21: 400 mg via INTRAVENOUS
  Filled 2013-04-21: qty 200

## 2013-04-21 MED ORDER — FENTANYL CITRATE 0.05 MG/ML IJ SOLN
INTRAMUSCULAR | Status: AC
  Filled 2013-04-21: qty 6

## 2013-04-21 MED ORDER — LIDOCAINE HCL (CARDIAC) 20 MG/ML IV SOLN
INTRAVENOUS | Status: DC | PRN
  Administered 2013-04-21: 60 mg via INTRAVENOUS

## 2013-04-21 MED ORDER — LACTATED RINGERS IV SOLN
INTRAVENOUS | Status: DC
  Administered 2013-04-21 (×2): via INTRAVENOUS
  Filled 2013-04-21: qty 1000

## 2013-04-21 MED ORDER — FENTANYL CITRATE 0.05 MG/ML IJ SOLN
INTRAMUSCULAR | Status: DC | PRN
  Administered 2013-04-21 (×2): 50 ug via INTRAVENOUS
  Administered 2013-04-21: 25 ug via INTRAVENOUS

## 2013-04-21 MED ORDER — BELLADONNA ALKALOIDS-OPIUM 16.2-60 MG RE SUPP
RECTAL | Status: AC
  Filled 2013-04-21: qty 1

## 2013-04-21 SURGICAL SUPPLY — 44 items
ADAPTER CATH URET PLST 4-6FR (CATHETERS) IMPLANT
ADPR CATH URET STRL DISP 4-6FR (CATHETERS)
APL SKNCLS STERI-STRIP NONHPOA (GAUZE/BANDAGES/DRESSINGS)
BAG DRAIN URO-CYSTO SKYTR STRL (DRAIN) ×2 IMPLANT
BAG DRN UROCATH (DRAIN) ×1
BASKET LASER NITINOL 1.9FR (BASKET) IMPLANT
BASKET STNLS GEMINI 4WIRE 3FR (BASKET) IMPLANT
BASKET ZERO TIP NITINOL 2.4FR (BASKET) ×2 IMPLANT
BENZOIN TINCTURE PRP APPL 2/3 (GAUZE/BANDAGES/DRESSINGS) IMPLANT
BSKT STON RTRVL 120 1.9FR (BASKET)
BSKT STON RTRVL GEM 120X11 3FR (BASKET)
BSKT STON RTRVL ZERO TP 2.4FR (BASKET) ×1
CANISTER SUCT LVC 12 LTR MEDI- (MISCELLANEOUS) ×1 IMPLANT
CATH INTERMIT  6FR 70CM (CATHETERS) ×1 IMPLANT
CATH URET 5FR 28IN CONE TIP (BALLOONS)
CATH URET 5FR 28IN OPEN ENDED (CATHETERS) IMPLANT
CATH URET 5FR 70CM CONE TIP (BALLOONS) IMPLANT
CLOTH BEACON ORANGE TIMEOUT ST (SAFETY) ×2 IMPLANT
DRAPE CAMERA CLOSED 9X96 (DRAPES) ×2 IMPLANT
DRSG TEGADERM 2-3/8X2-3/4 SM (GAUZE/BANDAGES/DRESSINGS) IMPLANT
FIBER LASER FLEXIVA 1000 (UROLOGICAL SUPPLIES) IMPLANT
FIBER LASER FLEXIVA 200 (UROLOGICAL SUPPLIES) ×1 IMPLANT
FIBER LASER FLEXIVA 365 (UROLOGICAL SUPPLIES) IMPLANT
FIBER LASER FLEXIVA 550 (UROLOGICAL SUPPLIES) IMPLANT
GLOVE BIO SURGEON STRL SZ7.5 (GLOVE) ×2 IMPLANT
GLOVE BIOGEL M 6.5 STRL (GLOVE) ×1 IMPLANT
GLOVE INDICATOR 6.5 STRL GRN (GLOVE) ×1 IMPLANT
GOWN STRL REIN XL XLG (GOWN DISPOSABLE) ×2 IMPLANT
GUIDEWIRE 0.038 PTFE COATED (WIRE) IMPLANT
GUIDEWIRE ANG ZIPWIRE 038X150 (WIRE) IMPLANT
GUIDEWIRE STR DUAL SENSOR (WIRE) ×1 IMPLANT
IV NS 1000ML (IV SOLUTION) ×4
IV NS 1000ML BAXH (IV SOLUTION) IMPLANT
IV NS IRRIG 3000ML ARTHROMATIC (IV SOLUTION) ×3 IMPLANT
KIT BALLIN UROMAX 15FX10 (LABEL) IMPLANT
KIT BALLN UROMAX 15FX4 (MISCELLANEOUS) IMPLANT
KIT BALLN UROMAX 26 75X4 (MISCELLANEOUS)
NS IRRIG 500ML POUR BTL (IV SOLUTION) ×1 IMPLANT
PACK CYSTOSCOPY (CUSTOM PROCEDURE TRAY) ×2 IMPLANT
SET HIGH PRES BAL DIL (LABEL)
SHEATH ACCESS URETERAL 38CM (SHEATH) ×1 IMPLANT
SHEATH ACCESS URETERAL 54CM (SHEATH) IMPLANT
STENT 6X24 (STENTS) IMPLANT
STENT URET 6FRX24 CONTOUR (STENTS) ×1 IMPLANT

## 2013-04-21 NOTE — Anesthesia Postprocedure Evaluation (Signed)
  Anesthesia Post-op Note  Patient: Kimberly Riley  Procedure(s) Performed: Procedure(s) (LRB): CYSTOSCOPY WITH RIGHT RETROGRADE PYELOGRAM, RIGHT DIGITAL URETEROSCOPY AND RIGHT STENT PLACEMENT (Right) HOLMIUM LASER APPLICATION (Right)  Patient Location: PACU  Anesthesia Type: General  Level of Consciousness: awake and alert   Airway and Oxygen Therapy: Patient Spontanous Breathing  Post-op Pain: mild  Post-op Assessment: Post-op Vital signs reviewed, Patient's Cardiovascular Status Stable, Respiratory Function Stable, Patent Airway and No signs of Nausea or vomiting  Last Vitals:  Filed Vitals:   04/21/13 1315  BP: 102/68  Pulse: 83  Temp: 36.5 C  Resp: 18    Post-op Vital Signs: stable   Complications: No apparent anesthesia complications

## 2013-04-21 NOTE — Op Note (Signed)
Preoperative diagnosis: Right proximal ureteral calculus 9 mm, right renal calculus 7 mm Postoperative diagnosis: Same  Procedure: Cystoscopy, right retrograde pyelography, flexible right ureteroscopy with holmium laser lithotripsy, basketing of fragments and double-J stent placement   Surgeon: Valetta Fulleravid S. Gage Weant M.D.  Anesthesia: Gen.  Indications: Ms. Kimberly Riley is 38 years of age. She has a prior history of nephrolithiasis. She recently developed right flank pain and was diagnosed with a 9 mm stone in her proximal right ureter just beneath the ureteropelvic junction. In addition there was a 7 mm stone in the upper pole calyx. She requested intervention after discussing options elected for ureteroscopy. She appears to understand the risks and benefits of this procedure. She has undergone this similar procedure in the past.     Technique and findings: Patient was brought the operating room where she had successful induction general anesthesia. She was placed in lithotomy position and prepped and draped in usual manner. She received perioperative antibiotics and PAS compression boots. Appropriate surgical timeout was performed. Cystoscopy revealed no significant abnormalities.  Retrograde polygrams done with fluoroscopic real time interpretation. A filling defect was noted right at the ureteral pelvic junction with mild to moderate obstruction. One could appreciate a calcification within the kidney as well.   Guidewire was placed in the right renal pelvis. A flexible ureteroscopic sheath was then placed with fluoroscopic guidance. A digital flexible ureteroscope was utilized. The stone the proximal ureter had migrated to the renal pelvis. It was pinned within the calyx and uses a a holmium laser lithotripter fiber the stone was broken into approximately 10-12 pieces. A 200  fiber was used at 0.8 joules and 5 Hz. The largest 6 pieces were basket extracted. A second stone in the upper pole calyx was identified  and also treated with laser lithotripsy with basketing of several fragments. No obvious complications or problems occurred. A guidewire was placed in the right renal pelvis. Over that a double-J stent was placed. Good positioning was confirmed. The bladder was emptied. Patient was brought to recovery room stable condition.

## 2013-04-21 NOTE — OR Nursing (Signed)
RT ureteral stone taking by Dr. Isabel Capricegrapey.

## 2013-04-21 NOTE — H&P (Signed)
Reason For Visit Seen today as a work-in for RLQ pain and nausea with vomiting.   Active Problems Problems  1. Calculus of ureter (592.1)   Assessed By: Jetta LoutWarden, Diane (Urology); Last Assessed: 16 Apr 2013 2. Nausea with vomiting (787.01)   Assessed By: Jetta LoutWarden, Diane (Urology); Last Assessed: 16 Apr 2013 3. Nephrolithiasis (592.0)   Assessed By: Jetta LoutWarden, Diane (Urology); Last Assessed: 16 Apr 2013  History of Present Illness 38 YO female patient of Dr. Ellin GoodieGrapey's seen today as a work-in for RLQ pain and nausea with vomiting.    GU HX:    Hx of nephrolithiasis. S/P cystoureteroscopy with ureteral stone extraction and DJ stent placement 05/20/11. She has had an uneventful recovery. Her stent was removed in the office 05/24/11 and she has continued to do well since that time.     Stone analysis reveals calcium oxalate with small component of carbonate apatite.     Interval HX:   Feels she has been trying to pass a stone since Jan. Last week pain much worse with 1 episode of vomiting. Saw PCP this am and [facility] uro[facility] which showed (1) left nephrolithiasis (1) mm uppoer pole (2) mm lower pole. (2) right upper pole (7) mm and mild hydronephrosis secondary to distal 9 X 5 mm right UVJ calculus. Denies hematuria, passage of stone material, or f/c.   Past Medical History Problems  1. History of depression (V11.8) 2. History of Irritable bowel syndrome with diarrhea (564.1)  Surgical History Problems  1. History of Cholecystectomy 2. History of Cystoscopy With Insertion Of Ureteral Stent Right 3. History of Cystoscopy With Ureteroscopy With Lithotripsy 4. History of Eye Surgery 5. History of Foot Surgery  Current Meds 1. Bactrim DS TABS;  Therapy: (Recorded:20Mar2015) to Recorded 2. Hyoscyamine Sulfate 0.125 MG Oral Tablet;  Therapy: (Recorded:16Apr2013) to Recorded 3. TraMADol HCl TABS; AS NEEDED;  Therapy: (Recorded:20Mar2015) to  Recorded  Allergies Medication  1. Penicillins  Family History Problems  1. Family history of Family Health Status - Father's Age : Father   4886yrs 2. Family history of Family Health Status - Mother's Age : Mother   4970yrs 3. Family history of Family Health Status Number Of Children   2 sons 4. Family history of Nephrolithiasis  Social History Problems  1. Alcohol Use   1 qd 2. Caffeine Use   2 qd 3. Marital History - Separated 4. Never A Smoker 5. Occupation:   Fish farm managerregistration clerk 6. Denied: History of Tobacco Use  Review of Systems Genitourinary, constitutional, skin, eye, otolaryngeal, hematologic/lymphatic, cardiovascular, pulmonary, endocrine, musculoskeletal, gastrointestinal, neurological and psychiatric system(s) were reviewed and pertinent findings if present are noted.  Gastrointestinal: nausea, vomiting and abdominal pain.    Vitals Vital Signs [Data Includes: Last 1 Day]  Recorded: 20Mar2015 11:34AM  Height: 5 ft 4 in Weight: 192 lb  BMI Calculated: 32.96 BSA Calculated: 1.92 Blood Pressure: 119 / 80 Temperature: 98.2 F Heart Rate: 86  Physical Exam Constitutional: Well nourished and well developed . No acute distress.  Abdomen: The abdomen is mildly obese. Mild tenderness in the RLQ is present, but no LLQ tenderness. No CVA tenderness.  Skin: Normal skin turgor and normal skin color and pigmentation.  Neuro/Psych:. Mood and affect are appropriate.    Results/Data Urine [Data Includes: Last 1 Day]   20Mar2015  COLOR YELLOW   APPEARANCE CLEAR   SPECIFIC GRAVITY 1.020   pH 6.0   GLUCOSE NEG mg/dL  BILIRUBIN NEG  KETONE NEG mg/dL  BLOOD MOD   PROTEIN TRACE mg/dL  UROBILINOGEN 0.2 mg/dL  NITRITE NEG   LEUKOCYTE ESTERASE NEG   SQUAMOUS EPITHELIAL/HPF MODERATE   WBC 3-6 WBC/hpf  RBC 11-20 RBC/hpf  BACTERIA NONE SEEN   CRYSTALS NONE SEEN   CASTS NONE SEEN    The following clinical lab reports were reviewed:  UA - microscopic hematuria  RBC: 11-20 RBC/HPF.  The following radiology reports were reviewed: CT urogram 04/16/13 which demonstrates bilateral nonobstructing renal calculi and mild right hydronephrosis secondary to large distal calculus.    Assessment Assessed  1. Nephrolithiasis (592.0) 2. Nausea with vomiting (787.01) 3. Calculus of ureter (592.1)  Plan Calculus of ureter  1. Start: Tamsulosin HCl - 0.4 MG Oral Capsule; TAKE 1 CAPSULE Daily Calculus of ureter, Nausea with vomiting, Nephrolithiasis  2. Start: Ondansetron HCl - 8 MG Oral Tablet; TAKE 1 TABLET Every 6 hours PRN Calculus of ureter, Nephrolithiasis  3. KUB; Status:Hold For - Exact Date,Appointment,Date of Service; Requested for:With next  appointment;  Health Maintenance  4. UA With REFLEX; [Do Not Release]; Status:Complete;   Done: 20Mar2015 11:26AM Nephrolithiasis  5. Follow-up MD/NP/PA Office  Follow-up  Status: Hold For - Appointment,Date of Service   Requested for: 20Mar2015  Will begin Tamsulosin 0.4 mg 1 po daily #30/0RF  Ondansetron 8 mg 1 po Q 6 hrs prn #20/0RF  RTC next week with Dr. Isabel Caprice for OV/KUB and to discuss ESWL vs cystoureteroscopy, R RPG, stone extraction, and possible double J stent placement. Instructed to contact on-call MD over weekend if she has any acute changes.   Offered her IM Ketorolac which she refused  She has Tramadol on hand which has been controlling pain. Offered Oxycodone which she refused.   Signatures Electronically signed by : Jetta Lout, ANP-C; Apr 16 2013 12:07PM EST

## 2013-04-21 NOTE — Anesthesia Procedure Notes (Signed)
Procedure Name: LMA Insertion Date/Time: 04/21/2013 12:10 PM Performed by: Renella CunasHAZEL, Natali Lavallee D Pre-anesthesia Checklist: Patient identified, Emergency Drugs available, Suction available and Patient being monitored Patient Re-evaluated:Patient Re-evaluated prior to inductionOxygen Delivery Method: Circle System Utilized Preoxygenation: Pre-oxygenation with 100% oxygen Intubation Type: IV induction Ventilation: Mask ventilation without difficulty LMA: LMA inserted LMA Size: 4.0 Number of attempts: 1 Airway Equipment and Method: bite block Placement Confirmation: positive ETCO2 Tube secured with: Tape Dental Injury: Teeth and Oropharynx as per pre-operative assessment

## 2013-04-21 NOTE — Discharge Instructions (Signed)

## 2013-04-21 NOTE — Anesthesia Postprocedure Evaluation (Deleted)
Immediate Anesthesia Transfer of Care Note  Patient: Kimberly Riley  Procedure(s) Performed: Procedure(s) (LRB): CYSTOSCOPY WITH RIGHT RETROGRADE PYELOGRAM, RIGHT DIGITAL URETEROSCOPY AND RIGHT STENT PLACEMENT (Right) HOLMIUM LASER APPLICATION (Right)  Patient Location: PACU  Anesthesia Type: General  Level of Consciousness: awake, oriented, sedated and patient cooperative  Airway & Oxygen Therapy: Patient Spontanous Breathing and Patient connected to face mask oxygen  Post-op Assessment: Report given to PACU RN and Post -op Vital signs reviewed and stable  Post vital signs: Reviewed and stable  Complications: No apparent anesthesia complications

## 2013-04-21 NOTE — Interval H&P Note (Signed)
History and Physical Interval Note:  04/21/2013 10:44 AM  Kimberly Riley  has presented today for surgery, with the diagnosis of RIGHT URETERAL CALCULUS, RIGHT RENAL CALCULI  The various methods of treatment have been discussed with the patient and family. After consideration of risks, benefits and other options for treatment, the patient has consented to  Procedure(s): CYSTOSCOPY WITH RETROGRADE PYELOGRAM, URETEROSCOPY AND POSSIBLE STENT PLACEMENT (Right) HOLMIUM LASER APPLICATION (Right) as a surgical intervention .  The patient's history has been reviewed, patient examined, no change in status, stable for surgery.  I have reviewed the patient's chart and labs.  Questions were answered to the patient's satisfaction.     Aubri Gathright S

## 2013-04-21 NOTE — Transfer of Care (Signed)
Immediate Anesthesia Transfer of Care Note  Patient: Joanie C Taylor  Procedure(s) Performed: Procedure(s) (LRB): CYSTOSCOPY WITH RIGHT RETROGRADE PYELOGRAM, RIGHT DIGITAL URETEROSCOPY AND RIGHT STENT PLACEMENT (Right) HOLMIUM LASER APPLICATION (Right)  Patient Location: PACU  Anesthesia Type: General  Level of Consciousness: awake, oriented, sedated and patient cooperative  Airway & Oxygen Therapy: Patient Spontanous Breathing and Patient connected to face mask oxygen  Post-op Assessment: Report given to PACU RN and Post -op Vital signs reviewed and stable  Post vital signs: Reviewed and stable  Complications: No apparent anesthesia complications 

## 2013-04-21 NOTE — Anesthesia Preprocedure Evaluation (Signed)
Anesthesia Evaluation  Patient identified by MRN, date of birth, ID band Patient awake    Reviewed: Allergy & Precautions, H&P , NPO status , Patient's Chart, lab work & pertinent test results  Airway Mallampati: II TM Distance: >3 FB Neck ROM: Full    Dental no notable dental hx. (+) Teeth Intact, Dental Advisory Given   Pulmonary neg pulmonary ROS,  breath sounds clear to auscultation  Pulmonary exam normal       Cardiovascular negative cardio ROS  Rhythm:Regular Rate:Normal     Neuro/Psych negative neurological ROS  negative psych ROS   GI/Hepatic negative GI ROS, Neg liver ROS, GERD-  Medicated,  Endo/Other  negative endocrine ROS  Renal/GU      Musculoskeletal negative musculoskeletal ROS (+)   Abdominal   Peds  Hematology negative hematology ROS (+)   Anesthesia Other Findings   Reproductive/Obstetrics negative OB ROS                           Anesthesia Physical  Anesthesia Plan  ASA: II  Anesthesia Plan: General   Post-op Pain Management:    Induction: Intravenous  Airway Management Planned: LMA  Additional Equipment:   Intra-op Plan:   Post-operative Plan: Extubation in OR  Informed Consent: I have reviewed the patients History and Physical, chart, labs and discussed the procedure including the risks, benefits and alternatives for the proposed anesthesia with the patient or authorized representative who has indicated his/her understanding and acceptance.   Dental advisory given  Plan Discussed with: CRNA  Anesthesia Plan Comments:         Anesthesia Quick Evaluation

## 2013-04-22 ENCOUNTER — Encounter (HOSPITAL_BASED_OUTPATIENT_CLINIC_OR_DEPARTMENT_OTHER): Payer: Self-pay | Admitting: Urology

## 2013-11-14 ENCOUNTER — Encounter (HOSPITAL_BASED_OUTPATIENT_CLINIC_OR_DEPARTMENT_OTHER): Payer: Self-pay | Admitting: Emergency Medicine

## 2013-11-14 ENCOUNTER — Emergency Department (HOSPITAL_BASED_OUTPATIENT_CLINIC_OR_DEPARTMENT_OTHER)
Admission: EM | Admit: 2013-11-14 | Discharge: 2013-11-14 | Disposition: A | Discharge status: Home / Self Care | Payer: 59 | Attending: Emergency Medicine | Admitting: Emergency Medicine

## 2013-11-14 ENCOUNTER — Emergency Department (HOSPITAL_BASED_OUTPATIENT_CLINIC_OR_DEPARTMENT_OTHER): Payer: 59

## 2013-11-14 DIAGNOSIS — Z3202 Encounter for pregnancy test, result negative: Secondary | ICD-10-CM | POA: Diagnosis not present

## 2013-11-14 DIAGNOSIS — R109 Unspecified abdominal pain: Secondary | ICD-10-CM

## 2013-11-14 DIAGNOSIS — Z792 Long term (current) use of antibiotics: Secondary | ICD-10-CM | POA: Insufficient documentation

## 2013-11-14 DIAGNOSIS — Z880 Allergy status to penicillin: Secondary | ICD-10-CM | POA: Diagnosis not present

## 2013-11-14 DIAGNOSIS — Z87442 Personal history of urinary calculi: Secondary | ICD-10-CM | POA: Diagnosis not present

## 2013-11-14 DIAGNOSIS — Z79899 Other long term (current) drug therapy: Secondary | ICD-10-CM | POA: Insufficient documentation

## 2013-11-14 DIAGNOSIS — N201 Calculus of ureter: Secondary | ICD-10-CM | POA: Insufficient documentation

## 2013-11-14 DIAGNOSIS — R000 Tachycardia, unspecified: Secondary | ICD-10-CM | POA: Insufficient documentation

## 2013-11-14 DIAGNOSIS — K219 Gastro-esophageal reflux disease without esophagitis: Secondary | ICD-10-CM | POA: Insufficient documentation

## 2013-11-14 LAB — URINALYSIS, ROUTINE W REFLEX MICROSCOPIC
Bilirubin Urine: NEGATIVE
Glucose, UA: NEGATIVE mg/dL
Ketones, ur: NEGATIVE mg/dL
Nitrite: NEGATIVE
PROTEIN: NEGATIVE mg/dL
Specific Gravity, Urine: 1.025 (ref 1.005–1.030)
Urobilinogen, UA: 0.2 mg/dL (ref 0.0–1.0)
pH: 6 (ref 5.0–8.0)

## 2013-11-14 LAB — URINE MICROSCOPIC-ADD ON

## 2013-11-14 LAB — PREGNANCY, URINE: PREG TEST UR: NEGATIVE

## 2013-11-14 MED ORDER — OXYCODONE-ACETAMINOPHEN 5-325 MG PO TABS: 1.0000 | ORAL_TABLET | Freq: Four times a day (QID) | ORAL | Status: DC | PRN

## 2013-11-14 MED ORDER — TAMSULOSIN HCL 0.4 MG PO CAPS: 0.4000 mg | ORAL_CAPSULE | Freq: Every day | ORAL | Status: DC

## 2013-11-14 MED ORDER — ONDANSETRON 4 MG PO TBDP: ORAL_TABLET | ORAL | Status: DC

## 2013-11-14 MED ORDER — ONDANSETRON HCL 4 MG/2ML IJ SOLN
4.0000 mg | Freq: Once | INTRAMUSCULAR | Status: AC
  Administered 2013-11-14: 4 mg via INTRAVENOUS
  Filled 2013-11-14: qty 2

## 2013-11-14 MED ORDER — HYDROMORPHONE HCL 1 MG/ML IJ SOLN
1.0000 mg | Freq: Once | INTRAMUSCULAR | Status: AC
  Administered 2013-11-14: 1 mg via INTRAVENOUS
  Filled 2013-11-14: qty 1

## 2013-11-14 MED ORDER — MORPHINE SULFATE 4 MG/ML IJ SOLN
4.0000 mg | Freq: Once | INTRAMUSCULAR | Status: AC
  Administered 2013-11-14: 4 mg via INTRAVENOUS
  Filled 2013-11-14: qty 1

## 2013-11-14 NOTE — ED Notes (Signed)
Pt request something else for pain PA aware.

## 2013-11-14 NOTE — ED Notes (Signed)
Patient having right flank pain and right lower abd pain, started earlier this week. Has happened on and off, but continues to worsen.

## 2013-11-14 NOTE — Discharge Instructions (Signed)
Take flomax as prescribed. Take percocet for severe pain only. No driving or operating heavy machinery while taking percocet. This medication may cause drowsiness. Take zofran as directed as needed for nausea. Follow up with urology.  Kidney Stones Kidney stones (urolithiasis) are deposits that form inside your kidneys. The intense pain is caused by the stone moving through the urinary tract. When the stone moves, the ureter goes into spasm around the stone. The stone is usually passed in the urine.  CAUSES   A disorder that makes certain neck glands produce too much parathyroid hormone (primary hyperparathyroidism).  A buildup of uric acid crystals, similar to gout in your joints.  Narrowing (stricture) of the ureter.  A kidney obstruction present at birth (congenital obstruction).  Previous surgery on the kidney or ureters.  Numerous kidney infections. SYMPTOMS   Feeling sick to your stomach (nauseous).  Throwing up (vomiting).  Blood in the urine (hematuria).  Pain that usually spreads (radiates) to the groin.  Frequency or urgency of urination. DIAGNOSIS   Taking a history and physical exam.  Blood or urine tests.  CT scan.  Occasionally, an examination of the inside of the urinary bladder (cystoscopy) is performed. TREATMENT   Observation.  Increasing your fluid intake.  Extracorporeal shock wave lithotripsy--This is a noninvasive procedure that uses shock waves to break up kidney stones.  Surgery may be needed if you have severe pain or persistent obstruction. There are various surgical procedures. Most of the procedures are performed with the use of small instruments. Only small incisions are needed to accommodate these instruments, so recovery time is minimized. The size, location, and chemical composition are all important variables that will determine the proper choice of action for you. Talk to your health care provider to better understand your situation so  that you will minimize the risk of injury to yourself and your kidney.  HOME CARE INSTRUCTIONS   Drink enough water and fluids to keep your urine clear or pale yellow. This will help you to pass the stone or stone fragments.  Strain all urine through the provided strainer. Keep all particulate matter and stones for your health care provider to see. The stone causing the pain may be as small as a grain of salt. It is very important to use the strainer each and every time you pass your urine. The collection of your stone will allow your health care provider to analyze it and verify that a stone has actually passed. The stone analysis will often identify what you can do to reduce the incidence of recurrences.  Only take over-the-counter or prescription medicines for pain, discomfort, or fever as directed by your health care provider.  Make a follow-up appointment with your health care provider as directed.  Get follow-up X-rays if required. The absence of pain does not always mean that the stone has passed. It may have only stopped moving. If the urine remains completely obstructed, it can cause loss of kidney function or even complete destruction of the kidney. It is your responsibility to make sure X-rays and follow-ups are completed. Ultrasounds of the kidney can show blockages and the status of the kidney. Ultrasounds are not associated with any radiation and can be performed easily in a matter of minutes. SEEK MEDICAL CARE IF:  You experience pain that is progressive and unresponsive to any pain medicine you have been prescribed. SEEK IMMEDIATE MEDICAL CARE IF:   Pain cannot be controlled with the prescribed medicine.  You have a fever  or shaking chills.  The severity or intensity of pain increases over 18 hours and is not relieved by pain medicine.  You develop a new onset of abdominal pain.  You feel faint or pass out.  You are unable to urinate. MAKE SURE YOU:   Understand these  instructions.  Will watch your condition.  Will get help right away if you are not doing well or get worse. Document Released: 01/14/2005 Document Revised: 09/16/2012 Document Reviewed: 06/17/2012 [facility] Patient Information 2015 [place], [place]. This information is not intended to replace advice given to you by your health care provider. Make sure you discuss any questions you have with your health care provider.  Urine Strainer This strainer is used to catch or filter out any stones found in your urine. Place the strainer under your urine stream. Save any stones or objects that you find in your urine. Place them in a plastic or glass container to show your caregiver. The stones vary in size - some can be very small, so make sure you check the strainer carefully. Your caregiver may send the stone to the lab. When the results are back, your caregiver may recommend medicines or diet changes.  Document Released: 10/20/2003 Document Revised: 04/08/2011 Document Reviewed: 11/27/2007 [facility] Patient Information 2015 [place], [place]. This information is not intended to replace advice given to you by your health care provider. Make sure you discuss any questions you have with your health care provider.  Ureteral Colic (Kidney Stones) Ureteral colic is the result of a condition when kidney stones form inside the kidney. Once kidney stones are formed they may move into the tube that connects the kidney with the bladder (ureter). If this occurs, this condition may cause pain (colic) in the ureter.  CAUSES  Pain is caused by stone movement in the ureter and the obstruction caused by the stone. SYMPTOMS  The pain comes and goes as the ureter contracts around the stone. The pain is usually intense, sharp, and stabbing in character. The location of the pain may move as the stone moves through the ureter. When the stone is near the kidney the pain is usually located in the back and radiates to the belly  (abdomen). When the stone is ready to pass into the bladder the pain is often located in the lower abdomen on the side the stone is located. At this location, the symptoms may mimic those of a urinary tract infection with urinary frequency. Once the stone is located here it often passes into the bladder and the pain disappears completely. TREATMENT   Your caregiver will provide you with medicine for pain relief.  You may require specialized follow-up X-rays.  The absence of pain does not always mean that the stone has passed. It may have just stopped moving. If the urine remains completely obstructed, it can cause loss of kidney function or even complete destruction of the involved kidney. It is your responsibility and in your interest that X-rays and follow-ups as suggested by your caregiver are completed. Relief of pain without passage of the stone can be associated with severe damage to the kidney, including loss of kidney function on that side.  If your stone does not pass on its own, additional measures may be taken by your caregiver to ensure its removal. HOME CARE INSTRUCTIONS   Increase your fluid intake. Water is the preferred fluid since juices containing vitamin C may acidify the urine making it less likely for certain stones (uric acid stones) to  pass.  Strain all urine. A strainer will be provided. Keep all particulate matter or stones for your caregiver to inspect.  Take your pain medicine as directed.  Make a follow-up appointment with your caregiver as directed.  Remember that the goal is passage of your stone. The absence of pain does not mean the stone is gone. Follow your caregiver's instructions.  Only take over-the-counter or prescription medicines for pain, discomfort, or fever as directed by your caregiver. SEEK MEDICAL CARE IF:   Pain cannot be controlled with the prescribed medicine.  You have a fever.  Pain continues for longer than your caregiver advises it  should.  There is a change in the pain, and you develop chest discomfort or constant abdominal pain.  You feel faint or pass out. MAKE SURE YOU:   Understand these instructions.  Will watch your condition.  Will get help right away if you are not doing well or get worse. Document Released: 10/24/2004 Document Revised: 05/11/2012 Document Reviewed: 07/11/2010 [facility] Patient Information 2015 [place], [place]. This information is not intended to replace advice given to you by your health care provider. Make sure you discuss any questions you have with your health care provider.

## 2013-11-14 NOTE — ED Provider Notes (Signed)
CSN: [id number]     Arrival date & time 11/14/13  1620 History   First MD Initiated Contact with Patient 11/14/13 1629     Chief Complaint  Patient presents with  . Flank Pain     (Consider location/radiation/quality/duration/timing/severity/associated sxs/prior Treatment) HPI Comments: This is a 38 year old female with a past medical history IBS, kidney stones and GERD who presents to the emergency department complaining of intermittent right flank pain over the past week, worsening throughout the day today. Patient reports she was sleeping and was awoken from sleep at 3:15 PM with sharp, stabbing right-sided flank pain radiating around to the front of her abdomen. No aggravating or alleviating factors. States this feels similar to her prior kidney stones in the past. Last stone was in March when she required surgery. Admits to associated nausea without vomiting. No fevers. No difficulty urinating. Admits to increased urinary frequency. Denies dysuria or hematuria.  Patient is a 38 y.o. female presenting with flank pain. The history is provided by the patient.  Flank Pain    Past Medical History  Diagnosis Date  . IBS (irritable bowel syndrome)   . History of kidney stones   . Right ureteral calculus   . Renal calculi     BILATERAL --- NON-OBSTRUCTIVE  . GERD (gastroesophageal reflux disease)    Past Surgical History  Procedure Laterality Date  . Essure tubal ligation  SEPT 2007  . Strabismus surgery  AS CHILD  . Cholecystectomy  1998  . Ureteroscopic laser litho/ stone extraction  1998  . Cytso/  right ureteroscopic laser lithotripsy stone extraction/  stent placement  05-20-2011  . Cystoscopy with retrograde pyelogram, ureteroscopy and stent placement Right 04/21/2013    Procedure: CYSTOSCOPY WITH RIGHT RETROGRADE PYELOGRAM, RIGHT DIGITAL URETEROSCOPY AND RIGHT STENT PLACEMENT;  Surgeon: Valetta Fulleravid S Grapey, MD;  [facility] [place];  Service: Urology;  Laterality:  Right;  . Holmium laser application Right 04/21/2013    Procedure: HOLMIUM LASER APPLICATION;  Surgeon: Valetta Fulleravid S Grapey, MD;  [facility] [place];  Service: Urology;  Laterality: Right;   No family history on file. History  Substance Use Topics  . Smoking status: Never Smoker   . Smokeless tobacco: Never Used  . Alcohol Use: 0.0 oz/week     Comment: rare   OB History   Grav Para Term Preterm Abortions TAB SAB Ect Mult Living                 Review of Systems  Genitourinary: Positive for flank pain.   10 Systems reviewed and are negative for acute change except as noted in the HPI.    Allergies  Penicillins  Home Medications   Prior to Admission medications   Medication Sig Start Date End Date Taking? Authorizing Provider  dicyclomine (BENTYL) 10 MG capsule Take 10 mg by mouth 4 (four) times daily -  before meals and at bedtime.   Yes Historical Provider, MD  HYDROcodone-acetaminophen (NORCO/VICODIN) 5-325 MG per tablet Take 1-2 tablets by mouth every 6 (six) hours as needed. 04/21/13   Valetta Fulleravid S Grapey, MD  Meth-Hyo-M Salley HewsBl-Na Phos-Ph Sal (URIBEL) 118 MG CAPS Take 1 capsule (118 mg total) by mouth 3 (three) times daily as needed. 04/21/13   Valetta Fulleravid S Grapey, MD  ondansetron (ZOFRAN ODT) 4 MG disintegrating tablet 4mg  ODT q4 hours prn nausea/vomit 11/14/13   Rhydian Baldi M Keyairra Kolinski, PA-C  oxyCODONE-acetaminophen (PERCOCET) 5-325 MG per tablet Take 1-2 tablets by mouth every 6 (six) hours as needed  for severe pain. 11/14/13   Evangelina Delancey M Eyob Godlewski, PA-C  Ranitidine HCl (ACID REDUCER PO) Take by mouth as needed.    Historical Provider, MD  sulfamethoxazole-trimethoprim (BACTRIM DS) 800-160 MG per tablet Take 1 tablet by mouth 2 (two) times daily.    Historical Provider, MD  tamsulosin (FLOMAX) 0.4 MG CAPS capsule Take 0.4 mg by mouth daily after supper.    Historical Provider, MD  tamsulosin (FLOMAX) 0.4 MG CAPS capsule Take 1 capsule (0.4 mg total) by mouth daily. 11/14/13   Kathrynn Speedobyn M Jervis Trapani, PA-C   traMADol (ULTRAM) 50 MG tablet Take by mouth every 6 (six) hours as needed.    Historical Provider, MD   BP 148/91  Pulse 107  Temp(Src) 98 F (36.7 C) (Oral)  Resp 22  SpO2 100% Physical Exam  Nursing note and vitals reviewed. Constitutional: She is oriented to person, place, and time. She appears well-developed and well-nourished. No distress.  Uncomfortable and in mild distress.  HENT:  Head: Normocephalic and atraumatic.  Mouth/Throat: Oropharynx is clear and moist.  Eyes: Conjunctivae are normal.  Neck: Normal range of motion. Neck supple.  Cardiovascular: Regular rhythm and normal heart sounds.   Tachycardia.  Pulmonary/Chest: Effort normal and breath sounds normal.  Abdominal: Soft. Normal appearance and bowel sounds are normal. She exhibits no distension. There is CVA tenderness (right).    Musculoskeletal: Normal range of motion. She exhibits no edema.  Neurological: She is alert and oriented to person, place, and time.  Skin: Skin is warm and dry. She is not diaphoretic.  Psychiatric: She has a normal mood and affect. Her behavior is normal.    ED Course  Procedures (including critical care time) Labs Review Labs Reviewed  URINALYSIS, ROUTINE W REFLEX MICROSCOPIC - Abnormal; Notable for the following:    APPearance CLOUDY (*)    Hgb urine dipstick MODERATE (*)    Leukocytes, UA SMALL (*)    All other components within normal limits  URINE MICROSCOPIC-ADD ON - Abnormal; Notable for the following:    Squamous Epithelial / LPF MANY (*)    Bacteria, UA MANY (*)    Casts HYALINE CASTS (*)    All other components within normal limits  URINE CULTURE  PREGNANCY, URINE    Imaging Review Ct Renal Stone Study  11/14/2013   CLINICAL DATA:  Right flank pain radiating into the right groin for 1 day. History of nephrolithiasis and irritable bowel syndrome. Previous cholecystectomy and tubal ligation.  EXAM: CT ABDOMEN AND PELVIS WITHOUT CONTRAST  TECHNIQUE:  Multidetector CT imaging of the abdomen and pelvis was performed following the standard protocol without IV contrast.  COMPARISON:  04/16/2013.  Abdomen radiographs dated 04/27/2013.  FINDINGS: Cholecystectomy clips. Moderate dilatation of the right renal collecting system and ureter to the level of a 7 mm calculus in the distal right ureter, just proximal to the ureterovesical junction. No significant urine in the bladder. There is also an interval 6 mm calculus in the mid left pelvic ureter on image number 68 of series 2, without ureteral dilatation or left renal collecting system dilatation. No bladder calculi are seen. There is a small calculus in the upper pole of the left kidney and a small calculus in the mid left kidney. There are also 2 adjacent small calculi in the mid right kidney.  Bilateral fallopian tube Essure devices are noted. Unremarkable non contrasted appearance of the liver, spleen, pancreas, adrenal glands and ovaries. No gastrointestinal abnormalities or enlarged lymph nodes. No evidence of appendicitis.  Clear lung bases. Mild lumbar and lower thoracic spine degenerative changes.  IMPRESSION: 1. 7 mm distal right ureteral calculus causing moderate right hydroureter and hydronephrosis. 2. 6 mm nonobstructing distal left ureteral calculus. 3. Small bilateral, nonobstructing renal calculi.   Electronically Signed   By: Gordan Payment M.D.   On: 11/14/2013 17:49     EKG Interpretation None      MDM   Final diagnoses:  Right ureteral stone    Patient nontoxic appearing, mildly distressed and uncomfortable. Afebrile, slightly tachycardic, vitals otherwise stable. Initial concern for kidney stone. CT without contrast obtained, 7 mm distal right ureteral calculus causing moderate right hydroureter and hydronephrosis, 6 mm nonobstructing distal left ureteral calculus. Urinalysis contaminated, doubt infection, 7-10 white cells, nitrite negative. Urine culture pending. I spoke with Dr.  Marlou Porch, on call for urology who states patient may be able to pass the stone on her own, and she can be discharged home with Flomax, pain and nausea medication, followup in their office next week. Will send patient home with urine strainer. Patient comfortable after receiving pain medication in the emergency department. Stable for discharge. Return precautions given. Patient states understanding of treatment care plan and is agreeable.  Kathrynn Speed, PA-C 11/14/13 [phone]

## 2013-11-15 NOTE — ED Provider Notes (Signed)
Medical screening examination/treatment/procedure(s) were performed by non-physician practitioner and as supervising physician I was immediately available for consultation/collaboration.   EKG Interpretation None       Adalyn Pennock, MD 11/15/13 0001 

## 2013-11-16 LAB — URINE CULTURE
Colony Count: NO GROWTH
Culture: NO GROWTH

## 2013-11-23 ENCOUNTER — Other Ambulatory Visit: Payer: Self-pay | Admitting: Urology

## 2013-11-24 ENCOUNTER — Encounter (HOSPITAL_BASED_OUTPATIENT_CLINIC_OR_DEPARTMENT_OTHER): Payer: Self-pay | Admitting: *Deleted

## 2013-11-24 NOTE — Progress Notes (Signed)
NPO AFTER MN. ARRIVE AT 0745. NEEDS HG . WILL TAKE ZANTAC AND IF NEEDED PAIN RX AM DOS W/ SIPS OF WATER.

## 2013-11-26 NOTE — H&P (Signed)
History of Present Illness   [name] was returning today for routine followup of her nephrolithiasis. In the interim, she tells me for about a month she has had intermittent right-sided abdominal flank discomfort along with nausea. Because of these symptoms, she did end up going to the emergency room. There, approximately a week ago, CT imaging was performed. She was noted to have a 7 mm distal right ureteral stone causing moderate hydroureter and hydronephrosis. She also had a 6 mm nonobstructing distal left calculus that she was not told about. That stone, again, is not causing any hydronephrosis and she has had no left-sided symptoms. She has continued to intermittently have pain and ongoing nausea. It appears this stone has been present for 1+ months. Within the kidney there are several small stones that are nonobstructing. These are present bilaterally but none appear bigger than a couple of millimeters. Previous stone analysis has been calcium oxalate. She has required intervention for stones on numerous occasions and by myself on 2 previous occasions. Her urine culture at the time at the emergency room was negative. I had reviewed her notes from the hospital visit as well as her x-rays.   On KUB imaging today, there does appear to be a faint calcification in the right hemi-pelvis. It is more difficult to appreciate an obvious stone on the left side. The renal calculi are not showing up well on KUB imaging today.     Past Medical History Problems  1. History of Calculus of ureter (N20.1) 2. History of depression (Z86.59) 3. History of Irritable bowel syndrome with diarrhea (K58.0)  Surgical History Problems  1. History of Cholecystectomy 2. History of Cystoscopy With Insertion Of Ureteral Stent Right 3. History of Cystoscopy With Insertion Of Ureteral Stent Right 4. History of Cystoscopy With Ureteroscopy With Lithotripsy 5. History of Cystoscopy With Ureteroscopy With Lithotripsy 6.  History of Eye Surgery 7. History of Foot Surgery  Current Meds 1. Flomax 0.4 MG Oral Capsule;  Therapy: (Recorded:27Oct2015) to Recorded 2. TraMADol HCl TABS; AS NEEDED;  Therapy: (Recorded:20Mar2015) to Recorded  Allergies Medication  1. Penicillins  Family History Problems  1. Family history of Family Health Status - Father's Age : Father   3543yrs 2. Family history of Family Health Status - Mother's Age : Mother   10279yrs 3. Family history of Family Health Status Number Of Children   2 sons 4. Family history of Nephrolithiasis  Social History Problems  1. Alcohol Use   1 qd 2. Caffeine Use   2 qd 3. Marital History - Separated 4. Never A Smoker 5. Occupation:   Fish farm managerregistration clerk 6. Denied: History of Tobacco Use  Review of Systems Genitourinary, constitutional, skin, eye, otolaryngeal, hematologic/lymphatic, cardiovascular, pulmonary, endocrine, musculoskeletal, gastrointestinal, neurological and psychiatric system(s) were reviewed and pertinent findings if present are noted.  Gastrointestinal: nausea, flank pain, abdominal pain and diarrhea.  Constitutional: feeling tired (fatigue).  Hematologic/Lymphatic: a tendency to easily bruise.  Musculoskeletal: back pain.  Neurological: dizziness and headache.    Vitals Vital Signs [Data Includes: Last 1 Day]  Recorded: 27Oct2015 10:33AM  Blood Pressure: 109 / 77 Temperature: 98.2 F Heart Rate: 93  Physical Exam Constitutional: Well nourished and well developed . No acute distress.  ENT:. The ears and nose are normal in appearance.  Pulmonary: No respiratory distress and normal respiratory rhythm and effort.  Cardiovascular: Heart rate and rhythm are normal . No peripheral edema.  Abdomen: The abdomen is soft and nontender. No masses are palpated. No CVA  tenderness. No hernias are palpable. No hepatosplenomegaly noted.  Skin: Normal skin turgor, no visible rash and no visible skin lesions.  Neuro/Psych:. Mood  and affect are appropriate.    Results/Data Urine [Data Includes: Last 1 Day]   27Oct2015  COLOR YELLOW   APPEARANCE CLOUDY   SPECIFIC GRAVITY 1.020   pH 6.0   GLUCOSE NEG mg/dL  BILIRUBIN NEG   KETONE NEG mg/dL  BLOOD SMALL   PROTEIN NEG mg/dL  UROBILINOGEN 0.2 mg/dL  NITRITE NEG   LEUKOCYTE ESTERASE NEG   SQUAMOUS EPITHELIAL/HPF FEW   WBC 3-6 WBC/hpf  RBC 3-6 RBC/hpf  BACTERIA FEW   CRYSTALS Calcium Oxalate crystals noted   CASTS NONE SEEN   Other MUCUS NOTED    Assessment Assessed  1. Nephrolithiasis (N20.0) 2. Hydronephrosis (N13.30) 3. Bilateral ureteral calculi (N20.1)  Plan Bilateral ureteral calculi  1. Follow-up Schedule Surgery Office  Follow-up  Status: Hold For - Appointment   Requested for: 27Oct2015 Health Maintenance  2. UA With REFLEX; [Do Not Release]; Status:Complete;   Done: 27Oct2015 10:11AM Unlinked  3. Stop: TraMADol HCl TABS  Discussion/Summary   [name] has bilateral distal ureteral calculi. She does appear to have obstruction on the right side and has had ongoing symptoms of nausea and pain. Her symptoms on the right have been present for about a month. She does not have a particularly good track record for passing stones. At this point, I do think it makes sense to go ahead and schedule intervention on a nonemergent but fairly urgent basis. I think it would be prudent to go ahead with bilateral ureteroscopy with Holmium laser lithotripsy. She may require double-J stents bilaterally, but we will see how things go. In the interim, she will stay on alpha-blocker therapy. We will see about trying to get her on the schedule for sometime this week or next week, pending her schedule and mine.   Signatures Electronically signed by : Barron Alvineavid Renesmee Raine, M.D.; Nov 23 2013  1:59PM EST

## 2013-11-28 NOTE — Anesthesia Preprocedure Evaluation (Addendum)
Anesthesia Evaluation  Patient identified by MRN, date of birth, ID band Patient awake    Reviewed: Allergy & Precautions, H&P , NPO status , Patient's Chart, lab work & pertinent test results  History of Anesthesia Complications (+) PONV and history of anesthetic complications  Airway Mallampati: II  TM Distance: >3 FB Neck ROM: Full    Dental no notable dental hx. (+) Dental Advisory Given, Teeth Intact   Pulmonary neg pulmonary ROS,  breath sounds clear to auscultation  Pulmonary exam normal       Cardiovascular Exercise Tolerance: Good negative cardio ROS  Rhythm:Regular Rate:Normal     Neuro/Psych negative neurological ROS  negative psych ROS   GI/Hepatic Neg liver ROS, GERD-  Medicated and Controlled,  Endo/Other  negative endocrine ROS  Renal/GU negative Renal ROSRenal calculi, bilateral  negative genitourinary   Musculoskeletal negative musculoskeletal ROS (+)   Abdominal   Peds negative pediatric ROS (+)  Hematology negative hematology ROS (+)   Anesthesia Other Findings Crowed teeth with overbite  Reproductive/Obstetrics negative OB ROS                          Anesthesia Physical Anesthesia Plan  ASA: II  Anesthesia Plan: General   Post-op Pain Management:    Induction: Intravenous  Airway Management Planned: LMA  Additional Equipment:   Intra-op Plan:   Post-operative Plan: Extubation in OR  Informed Consent: I have reviewed the patients History and Physical, chart, labs and discussed the procedure including the risks, benefits and alternatives for the proposed anesthesia with the patient or authorized representative who has indicated his/her understanding and acceptance.   Dental advisory given  Plan Discussed with: CRNA  Anesthesia Plan Comments: (Scop patch placed for history of PONV)       Anesthesia Quick Evaluation

## 2013-11-29 ENCOUNTER — Encounter (HOSPITAL_BASED_OUTPATIENT_CLINIC_OR_DEPARTMENT_OTHER): Payer: Self-pay | Admitting: Anesthesiology

## 2013-11-29 ENCOUNTER — Encounter (HOSPITAL_BASED_OUTPATIENT_CLINIC_OR_DEPARTMENT_OTHER)
Admission: RE | Disposition: A | Discharge status: Home / Self Care | Payer: Self-pay | Source: Ambulatory Visit | Attending: Urology

## 2013-11-29 ENCOUNTER — Ambulatory Visit (HOSPITAL_BASED_OUTPATIENT_CLINIC_OR_DEPARTMENT_OTHER)
Admission: RE | Admit: 2013-11-29 | Discharge: 2013-11-29 | Disposition: A | Discharge status: Home / Self Care | Payer: 59 | Source: Ambulatory Visit | Attending: Urology | Admitting: Urology

## 2013-11-29 ENCOUNTER — Ambulatory Visit (HOSPITAL_BASED_OUTPATIENT_CLINIC_OR_DEPARTMENT_OTHER): Payer: 59 | Admitting: Anesthesiology

## 2013-11-29 DIAGNOSIS — N202 Calculus of kidney with calculus of ureter: Secondary | ICD-10-CM | POA: Insufficient documentation

## 2013-11-29 DIAGNOSIS — F329 Major depressive disorder, single episode, unspecified: Secondary | ICD-10-CM | POA: Diagnosis not present

## 2013-11-29 DIAGNOSIS — Z880 Allergy status to penicillin: Secondary | ICD-10-CM | POA: Insufficient documentation

## 2013-11-29 DIAGNOSIS — K589 Irritable bowel syndrome without diarrhea: Secondary | ICD-10-CM | POA: Diagnosis not present

## 2013-11-29 HISTORY — DX: Nausea with vomiting, unspecified: Z98.890

## 2013-11-29 HISTORY — DX: Nausea with vomiting, unspecified: R11.2

## 2013-11-29 HISTORY — PX: CYSTOSCOPY WITH RETROGRADE PYELOGRAM, URETEROSCOPY AND STENT PLACEMENT: SHX5789

## 2013-11-29 HISTORY — PX: CYSTOSCOPY W/ RETROGRADES: SHX1426

## 2013-11-29 HISTORY — DX: Irregular menstruation, unspecified: N92.6

## 2013-11-29 LAB — POCT HEMOGLOBIN-HEMACUE: HEMOGLOBIN: 13.6 g/dL (ref 12.0–15.0)

## 2013-11-29 SURGERY — CYSTOSCOPY, WITH RETROGRADE PYELOGRAM
Anesthesia: General | Site: Ureter | Laterality: Left

## 2013-11-29 MED ORDER — MIDAZOLAM HCL 5 MG/5ML IJ SOLN
INTRAMUSCULAR | Status: DC | PRN
  Administered 2013-11-29: 2 mg via INTRAVENOUS

## 2013-11-29 MED ORDER — PROPOFOL INFUSION 10 MG/ML OPTIME
INTRAVENOUS | Status: DC | PRN
  Administered 2013-11-29: 25 mL via INTRAVENOUS
  Administered 2013-11-29: 150 mL via INTRAVENOUS

## 2013-11-29 MED ORDER — FENTANYL CITRATE 0.05 MG/ML IJ SOLN
INTRAMUSCULAR | Status: AC
  Filled 2013-11-29: qty 4

## 2013-11-29 MED ORDER — MIDAZOLAM HCL 2 MG/2ML IJ SOLN
INTRAMUSCULAR | Status: AC
  Filled 2013-11-29: qty 2

## 2013-11-29 MED ORDER — ONDANSETRON HCL 4 MG/2ML IJ SOLN
4.0000 mg | Freq: Once | INTRAMUSCULAR | Status: DC | PRN
  Filled 2013-11-29: qty 2

## 2013-11-29 MED ORDER — SCOPOLAMINE 1 MG/3DAYS TD PT72
MEDICATED_PATCH | TRANSDERMAL | Status: AC
  Filled 2013-11-29: qty 1

## 2013-11-29 MED ORDER — SODIUM CHLORIDE 0.9 % IR SOLN
Status: DC | PRN
  Administered 2013-11-29: 6000 mL

## 2013-11-29 MED ORDER — LACTATED RINGERS IV SOLN
INTRAVENOUS | Status: DC
  Administered 2013-11-29: 09:00:00 via INTRAVENOUS
  Filled 2013-11-29: qty 1000

## 2013-11-29 MED ORDER — ACETAMINOPHEN 10 MG/ML IV SOLN
INTRAVENOUS | Status: DC | PRN
  Administered 2013-11-29: 1000 mg via INTRAVENOUS

## 2013-11-29 MED ORDER — IOHEXOL 350 MG/ML SOLN
INTRAVENOUS | Status: DC | PRN
  Administered 2013-11-29: 10 mL

## 2013-11-29 MED ORDER — URIBEL 118 MG PO CAPS: 1.0000 | ORAL_CAPSULE | Freq: Three times a day (TID) | ORAL | Status: DC | PRN

## 2013-11-29 MED ORDER — DEXAMETHASONE SODIUM PHOSPHATE 10 MG/ML IJ SOLN
INTRAMUSCULAR | Status: DC | PRN
  Administered 2013-11-29: 10 mg via INTRAVENOUS

## 2013-11-29 MED ORDER — FENTANYL CITRATE 0.05 MG/ML IJ SOLN
INTRAMUSCULAR | Status: DC | PRN
  Administered 2013-11-29 (×3): 50 ug via INTRAVENOUS

## 2013-11-29 MED ORDER — ONDANSETRON HCL 4 MG/2ML IJ SOLN
INTRAMUSCULAR | Status: DC | PRN
  Administered 2013-11-29: 4 mg via INTRAVENOUS

## 2013-11-29 MED ORDER — CIPROFLOXACIN IN D5W 400 MG/200ML IV SOLN
400.0000 mg | INTRAVENOUS | Status: AC
  Administered 2013-11-29: 400 mg via INTRAVENOUS
  Filled 2013-11-29: qty 200

## 2013-11-29 MED ORDER — LACTATED RINGERS IV SOLN
INTRAVENOUS | Status: DC | PRN
  Administered 2013-11-29: 09:00:00 via INTRAVENOUS

## 2013-11-29 MED ORDER — SCOPOLAMINE 1 MG/3DAYS TD PT72
1.0000 | MEDICATED_PATCH | TRANSDERMAL | Status: DC
  Administered 2013-11-29: 1.5 mg via TRANSDERMAL
  Filled 2013-11-29: qty 1

## 2013-11-29 MED ORDER — FENTANYL CITRATE 0.05 MG/ML IJ SOLN
25.0000 ug | INTRAMUSCULAR | Status: DC | PRN
  Filled 2013-11-29: qty 1

## 2013-11-29 MED ORDER — CIPROFLOXACIN IN D5W 400 MG/200ML IV SOLN
INTRAVENOUS | Status: AC
  Filled 2013-11-29: qty 200

## 2013-11-29 MED ORDER — LIDOCAINE HCL (CARDIAC) 20 MG/ML IV SOLN
INTRAVENOUS | Status: DC | PRN
  Administered 2013-11-29: 80 mg via INTRAVENOUS

## 2013-11-29 SURGICAL SUPPLY — 40 items
ADAPTER CATH URET PLST 4-6FR (CATHETERS) IMPLANT
ADPR CATH URET STRL DISP 4-6FR (CATHETERS)
APL SKNCLS STERI-STRIP NONHPOA (GAUZE/BANDAGES/DRESSINGS) ×3
BAG DRAIN URO-CYSTO SKYTR STRL (DRAIN) ×5 IMPLANT
BAG DRN UROCATH (DRAIN) ×3
BASKET LASER NITINOL 1.9FR (BASKET) IMPLANT
BASKET STNLS GEMINI 4WIRE 3FR (BASKET) IMPLANT
BASKET ZERO TIP NITINOL 2.4FR (BASKET) ×5 IMPLANT
BENZOIN TINCTURE PRP APPL 2/3 (GAUZE/BANDAGES/DRESSINGS) ×3 IMPLANT
BSKT STON RTRVL 120 1.9FR (BASKET)
BSKT STON RTRVL GEM 120X11 3FR (BASKET)
BSKT STON RTRVL ZERO TP 2.4FR (BASKET) ×3
CANISTER SUCT LVC 12 LTR MEDI- (MISCELLANEOUS) IMPLANT
CATH INTERMIT  6FR 70CM (CATHETERS) IMPLANT
CATH URET 5FR 28IN CONE TIP (BALLOONS)
CATH URET 5FR 28IN OPEN ENDED (CATHETERS) IMPLANT
CATH URET 5FR 70CM CONE TIP (BALLOONS) IMPLANT
CLOTH BEACON ORANGE TIMEOUT ST (SAFETY) ×5 IMPLANT
DRAPE CAMERA CLOSED 9X96 (DRAPES) ×5 IMPLANT
DRSG TEGADERM 2-3/8X2-3/4 SM (GAUZE/BANDAGES/DRESSINGS) ×3 IMPLANT
FIBER LASER FLEXIVA 1000 (UROLOGICAL SUPPLIES) IMPLANT
FIBER LASER FLEXIVA 200 (UROLOGICAL SUPPLIES) IMPLANT
FIBER LASER FLEXIVA 365 (UROLOGICAL SUPPLIES) IMPLANT
FIBER LASER FLEXIVA 550 (UROLOGICAL SUPPLIES) IMPLANT
GLOVE BIO SURGEON STRL SZ7.5 (GLOVE) ×5 IMPLANT
GLOVE SURG SS PI 7.5 STRL IVOR (GLOVE) ×6 IMPLANT
GOWN STRL REIN XL XLG (GOWN DISPOSABLE) ×5 IMPLANT
GUIDEWIRE 0.038 PTFE COATED (WIRE) IMPLANT
GUIDEWIRE ANG ZIPWIRE 038X150 (WIRE) IMPLANT
GUIDEWIRE STR DUAL SENSOR (WIRE) IMPLANT
IV NS IRRIG 3000ML ARTHROMATIC (IV SOLUTION) ×10 IMPLANT
KIT BALLIN UROMAX 15FX10 (LABEL) IMPLANT
KIT BALLN UROMAX 15FX4 (MISCELLANEOUS) ×1 IMPLANT
KIT BALLN UROMAX 26 75X4 (MISCELLANEOUS) ×2
NS IRRIG 500ML POUR BTL (IV SOLUTION) IMPLANT
PACK CYSTO (CUSTOM PROCEDURE TRAY) ×5 IMPLANT
SET HIGH PRES BAL DIL (LABEL)
SHEATH ACCESS URETERAL 38CM (SHEATH) ×3 IMPLANT
SHEATH ACCESS URETERAL 54CM (SHEATH) IMPLANT
STENT URET 6FRX24 CONTOUR (STENTS) ×3 IMPLANT

## 2013-11-29 NOTE — Op Note (Signed)
Preoperative diagnosis:bilateral ureteral calculi Postoperative diagnosis:left ureteral calculus  Procedure:cystoscopy, bilateral retrograde pyelography, left ureteroscopy, basketing of stone, left double-J stent insertion   Surgeon: Bernestine Amass M.D.  Anesthesia: Gen.  Indications:this Kimberly Riley has a prior history of nephrolithiasis. She presented recently with right-sided abdominal and flank discomfort. CT imaging in the emergency room revealed a 7 mm distal right ureteral stone causing moderate hydroureter and hydronephrosis. A 6 mm nonobstructing distal left stone was also incidentally noted. She had had symptoms for approximately one month and was having ongoing discomfort. For that reason we suggested consideration for intervention. She now presents for definitive management. Risks benefits discussed with her in detail.     Technique and findings:patient was brought the operating room where she had successful induction of general anesthesia. She was placed in lithotomy position and prepped and draped in the usual manner. Appropriate surgical timeout was performed. She received perioperative antibiotics and placement of PAS compression boots. Cystoscopy revealed a unremarkable urethra. The bladder showed no evidence of obvious stones edema tomorrow other abnormalities. We did notice that the left ureteral orifice had a bridge of tissue with 2 separate openings consistent potentially with some previous trauma of her ureteral orifice. At first we thought this might be a duplicated system but it was actually just a bridge of tissue between these 2 openings.  We initiated evaluation on the right side. An open-ended catheter was used to perform retrograde pyelography on the right. We saw no evidence of any filling defects. The ureter and collecting system on the right were completely delicate and the previously noted hydronephrosis appeared to have resolved. I did place a wire into the orifice and brought  it up to the kidney. I met no resistance and could see no evidence of ureteral stone on the right side. Presumptively that has passed prior to surgery today. Attention was then turned to retrograde pyelogram on the left side. Again the ureter appeared delicate. There did appear to be a filling defect in the distal to mid ureter. A guidewire was placed in the left orifice and placed up to the left renal unit.  The left ureter was engaged with a 6 French ureteroscope. As we got to the proximal ureter the stone was seen and a clearly had migrated more proximally. We ended up placing a flexible ureteroscope access sheath and then going after the stone with the flexible ureteroscope. A 6 mm stone was encountered and basket extracted without difficulty. A 6 French double-J stent was placed with a dangle string. This was secured to the patient's inner thigh. The bladder was drained. No obvious common occasions or problems occurred and she was brought to recovery room in stable condition.

## 2013-11-29 NOTE — Anesthesia Procedure Notes (Signed)
Procedure Name: LMA Insertion Date/Time: 11/29/2013 9:06 AM Performed by: Tyrone NineSAUVE, Darby Fleeman F Pre-anesthesia Checklist: Patient identified, Timeout performed, Emergency Drugs available, Suction available and Patient being monitored Patient Re-evaluated:Patient Re-evaluated prior to inductionOxygen Delivery Method: Circle system utilized Preoxygenation: Pre-oxygenation with 100% oxygen Intubation Type: IV induction Ventilation: Mask ventilation without difficulty LMA: LMA inserted LMA Size: 4.0 Number of attempts: 1 Airway Equipment and Method: Bite block Placement Confirmation: positive ETCO2 Tube secured with: Tape Dental Injury: Teeth and Oropharynx as per pre-operative assessment

## 2013-11-29 NOTE — Anesthesia Postprocedure Evaluation (Signed)
  Anesthesia Post-op Note  Patient: Kimberly Riley  Procedure(s) Performed: Procedure(s) (LRB): CYSTOSCOPY WITH RETROGRADE PYELOGRAM (Bilateral) CYSTOSCOPY WITH URETEROSCOPY AND STENT PLACEMENT (Left)  Patient Location: PACU  Anesthesia Type: General  Level of Consciousness: awake and alert   Airway and Oxygen Therapy: Patient Spontanous Breathing  Post-op Pain: mild  Post-op Assessment: Post-op Vital signs reviewed, Patient's Cardiovascular Status Stable, Respiratory Function Stable, Patent Airway and No signs of Nausea or vomiting  Last Vitals:  Filed Vitals:   11/29/13 1000  BP: 110/80  Pulse: 93  Temp: 36.5 C  Resp: 18    Post-op Vital Signs: stable   Complications: No apparent anesthesia complications

## 2013-11-29 NOTE — Transfer of Care (Signed)
Immediate Anesthesia Transfer of Care Note  Patient: Kimberly Riley  Procedure(s) Performed: Procedure(s): CYSTOSCOPY WITH RETROGRADE PYELOGRAM (Bilateral) CYSTOSCOPY WITH URETEROSCOPY AND STENT PLACEMENT (Left)  Patient Location: PACU  Anesthesia Type:General  Level of Consciousness: awake, alert , oriented and patient cooperative  Airway & Oxygen Therapy: Patient Spontanous Breathing and Patient connected to nasal cannula oxygen  Post-op Assessment: Report given to PACU RN and Post -op Vital signs reviewed and stable  Post vital signs: Reviewed and stable  Complications: No apparent anesthesia complications

## 2013-11-29 NOTE — Interval H&P Note (Signed)
History and Physical Interval Note:  11/29/2013 8:36 AM  Kimberly Riley  has presented today for surgery, with the diagnosis of BILATERAL URETERAL CALCULI  The various methods of treatment have been discussed with the patient and family. After consideration of risks, benefits and other options for treatment, the patient has consented to  Procedure(s): CYSTOSCOPY WITH RETROGRADE PYELOGRAM, URETEROSCOPY AND STENT PLACEMENT (Bilateral) HOLMIUM LASER APPLICATION (Bilateral) as a surgical intervention .  The patient's history has been reviewed, patient examined, no change in status, stable for surgery.  I have reviewed the patient's chart and labs.  Questions were answered to the patient's satisfaction.     Juquan Reznick S

## 2013-11-29 NOTE — OR Nursing (Signed)
Left ureteral stone taking by Dr. Isabel CapriceGrapey.

## 2013-11-29 NOTE — Discharge Instructions (Signed)
[facility] [phone] Post Ureteroscopy With or Without Stent Instructions  Definitions:  Ureter: The duct that transports urine from the kidney to the bladder. Stent:   A plastic hollow tube that is placed into the ureter, from the kidney to the bladder to prevent the ureter from swelling shut.  GENERAL INSTRUCTIONS:  Despite the fact that no skin incisions were used, the area around the ureter and bladder is raw and irritated. The stent is a foreign body which will further irritate the bladder wall. This irritation is manifested by increased frequency of urination, both day and night, and by an increase in the urge to urinate. In some, the urge to urinate is present almost always. Sometimes the urge is strong enough that you may not be able to stop yourself from urinating. The only real cure is to remove the stent and then give time for the bladder wall to heal which can't be done until the danger of the ureter swelling shut has passed, which varies.  You may see some blood in your urine while the stent is in place and a few days afterwards. Do not be alarmed, even if the urine was clear for a while. Get off your feet and drink lots of fluids until clearing occurs. If you start to pass clots or don't improve, call us.  DIET: You may return to your normal diet immediately. Because of the raw surface of your bladder, alcohol, spicy foods, acid type foods and drinks with caffeine may cause irritation or frequency and should be used in moderation. To keep your urine flowing freely and to avoid constipation, drink plenty of fluids during the day ( 8-10 glasses ). Tip: Avoid cranberry juice because it is very acidic.  ACTIVITY: Your physical activity doesn't need to be restricted. However, if you are very active, you may see some blood in your urine. We suggest that you reduce your activity under these circumstances until the bleeding has stopped.  BOWELS: It is important to  keep your bowels regular during the postoperative period. Straining with bowel movements can cause bleeding. A bowel movement every other day is reasonable. Use a mild laxative if needed, such as Milk of Magnesia 2-3 tablespoons, or 2 Dulcolax tablets. Call if you continue to have problems. If you have been taking narcotics for pain, before, during or after your surgery, you may be constipated. Take a laxative if necessary.   MEDICATION: You should resume your pre-surgery medications unless told not to. In addition you will often be given an antibiotic to prevent infection. These should be taken as prescribed until the bottles are finished unless you are having an unusual reaction to one of the drugs.  PROBLEMS YOU SHOULD REPORT TO US:  Fevers over 100.5 Fahrenheit.  Heavy bleeding, or clots ( See above notes about blood in urine ).  Inability to urinate.  Drug reactions ( hives, rash, nausea, vomiting, diarrhea ).  Severe burning or pain with urination that is not improving.  FOLLOW-UP:you can remove your stent on Thursday morning by carefully pulling the string. If you do not feel comfortable doing this would be happy to assist you in our office. We will call you to set up follow-up to discuss additional stone prevention strategies You will need a follow-up appointment to monitor your progress. Call for this appointment at the number listed above. Usually the first appointment will be about three to fourteen days after your surgery.    Post Anesthesia Home Care Instructions  Activity: Get plenty of rest for the remainder of the day. A responsible adult should stay with you for 24 hours following the procedure.  For the next 24 hours, DO NOT: -Drive a car -Advertising copywriterperate machinery -Drink alcoholic beverages -Take any medication unless instructed by your physician -Make any legal decisions or sign important papers.  Meals: Start with liquid foods such as gelatin or soup. Progress to  regular foods as tolerated. Avoid greasy, spicy, heavy foods. If nausea and/or vomiting occur, drink only clear liquids until the nausea and/or vomiting subsides. Call your physician if vomiting continues.  Special Instructions/Symptoms: Your throat may feel dry or sore from the anesthesia or the breathing tube placed in your throat during surgery. If this causes discomfort, gargle with warm salt water. The discomfort should disappear within 24 hours.

## 2013-11-30 ENCOUNTER — Encounter (HOSPITAL_BASED_OUTPATIENT_CLINIC_OR_DEPARTMENT_OTHER): Payer: Self-pay | Admitting: Urology

## 2013-12-29 ENCOUNTER — Encounter (HOSPITAL_BASED_OUTPATIENT_CLINIC_OR_DEPARTMENT_OTHER): Payer: Self-pay | Admitting: Urology

## 2014-04-12 ENCOUNTER — Encounter (HOSPITAL_BASED_OUTPATIENT_CLINIC_OR_DEPARTMENT_OTHER): Payer: Self-pay | Admitting: *Deleted

## 2014-04-12 ENCOUNTER — Other Ambulatory Visit: Payer: Self-pay | Admitting: Urology

## 2014-04-15 ENCOUNTER — Encounter (HOSPITAL_BASED_OUTPATIENT_CLINIC_OR_DEPARTMENT_OTHER): Payer: Self-pay | Admitting: *Deleted

## 2014-04-15 NOTE — Progress Notes (Signed)
[facility] -Hg on arrival-Npo after Mn-may take am meds as need with small amt water.

## 2014-04-18 ENCOUNTER — Ambulatory Visit (HOSPITAL_BASED_OUTPATIENT_CLINIC_OR_DEPARTMENT_OTHER): Admission: RE | Admit: 2014-04-18 | Payer: 59 | Source: Ambulatory Visit | Admitting: Urology

## 2014-04-18 HISTORY — DX: Calculus of ureter: N20.1

## 2014-04-18 SURGERY — CYSTOURETEROSCOPY, WITH RETROGRADE PYELOGRAM AND STENT INSERTION
Anesthesia: General | Laterality: Right

## 2014-11-05 ENCOUNTER — Emergency Department (HOSPITAL_BASED_OUTPATIENT_CLINIC_OR_DEPARTMENT_OTHER): Payer: 59

## 2014-11-05 ENCOUNTER — Emergency Department (HOSPITAL_BASED_OUTPATIENT_CLINIC_OR_DEPARTMENT_OTHER)
Admission: EM | Admit: 2014-11-05 | Discharge: 2014-11-05 | Disposition: A | Discharge status: Home / Self Care | Payer: 59 | Attending: Emergency Medicine | Admitting: Emergency Medicine

## 2014-11-05 ENCOUNTER — Encounter (HOSPITAL_BASED_OUTPATIENT_CLINIC_OR_DEPARTMENT_OTHER): Payer: Self-pay | Admitting: Emergency Medicine

## 2014-11-05 DIAGNOSIS — Z9851 Tubal ligation status: Secondary | ICD-10-CM | POA: Diagnosis not present

## 2014-11-05 DIAGNOSIS — K219 Gastro-esophageal reflux disease without esophagitis: Secondary | ICD-10-CM | POA: Diagnosis not present

## 2014-11-05 DIAGNOSIS — R109 Unspecified abdominal pain: Secondary | ICD-10-CM | POA: Diagnosis present

## 2014-11-05 DIAGNOSIS — N201 Calculus of ureter: Secondary | ICD-10-CM | POA: Diagnosis not present

## 2014-11-05 DIAGNOSIS — R52 Pain, unspecified: Secondary | ICD-10-CM

## 2014-11-05 DIAGNOSIS — Z880 Allergy status to penicillin: Secondary | ICD-10-CM | POA: Diagnosis not present

## 2014-11-05 DIAGNOSIS — Z3202 Encounter for pregnancy test, result negative: Secondary | ICD-10-CM | POA: Insufficient documentation

## 2014-11-05 LAB — COMPREHENSIVE METABOLIC PANEL
ALBUMIN: 4.1 g/dL (ref 3.5–5.0)
ALT: 16 U/L (ref 14–54)
AST: 20 U/L (ref 15–41)
Alkaline Phosphatase: 49 U/L (ref 38–126)
Anion gap: 9 (ref 5–15)
BUN: 16 mg/dL (ref 6–20)
CO2: 21 mmol/L — AB (ref 22–32)
CREATININE: 0.89 mg/dL (ref 0.44–1.00)
Calcium: 8.6 mg/dL — ABNORMAL LOW (ref 8.9–10.3)
Chloride: 108 mmol/L (ref 101–111)
GFR calc non Af Amer: >60 mL/min (ref >60)
GLUCOSE: 123 mg/dL — AB (ref 65–99)
Potassium: 3.2 mmol/L — ABNORMAL LOW (ref 3.5–5.1)
Sodium: 138 mmol/L (ref 135–145)
Total Bilirubin: 0.6 mg/dL (ref 0.3–1.2)
Total Protein: 7.1 g/dL (ref 6.5–8.1)

## 2014-11-05 LAB — CBC WITH DIFFERENTIAL/PLATELET
BASOS PCT: 1 %
Basophils Absolute: 0.1 10*3/uL (ref 0.0–0.1)
EOS ABS: 0.3 10*3/uL (ref 0.0–0.7)
EOS PCT: 2 %
HCT: 38.8 % (ref 36.0–46.0)
Hemoglobin: 13.2 g/dL (ref 12.0–15.0)
Lymphocytes Relative: 31 %
Lymphs Abs: 4.2 10*3/uL — ABNORMAL HIGH (ref 0.7–4.0)
MCH: 31.1 pg (ref 26.0–34.0)
MCHC: 34 g/dL (ref 30.0–36.0)
MCV: 91.3 fL (ref 78.0–100.0)
MONO ABS: 0.8 10*3/uL (ref 0.1–1.0)
MONOS PCT: 6 %
Neutro Abs: 8.1 10*3/uL — ABNORMAL HIGH (ref 1.7–7.7)
Neutrophils Relative %: 60 %
PLATELETS: 241 10*3/uL (ref 150–400)
RBC: 4.25 MIL/uL (ref 3.87–5.11)
RDW: 12.9 % (ref 11.5–15.5)
WBC: 13.6 10*3/uL — ABNORMAL HIGH (ref 4.0–10.5)

## 2014-11-05 LAB — URINALYSIS, ROUTINE W REFLEX MICROSCOPIC
BILIRUBIN URINE: NEGATIVE
GLUCOSE, UA: NEGATIVE mg/dL
KETONES UR: NEGATIVE mg/dL
Nitrite: NEGATIVE
PH: 6 (ref 5.0–8.0)
Protein, ur: NEGATIVE mg/dL
SPECIFIC GRAVITY, URINE: 1.016 (ref 1.005–1.030)
Urobilinogen, UA: 0.2 mg/dL (ref 0.0–1.0)

## 2014-11-05 LAB — URINE MICROSCOPIC-ADD ON

## 2014-11-05 LAB — PREGNANCY, URINE: PREG TEST UR: NEGATIVE

## 2014-11-05 MED ORDER — ONDANSETRON HCL 4 MG/2ML IJ SOLN
4.0000 mg | Freq: Once | INTRAMUSCULAR | Status: AC
  Administered 2014-11-05: 4 mg via INTRAVENOUS
  Filled 2014-11-05: qty 2

## 2014-11-05 MED ORDER — ONDANSETRON 4 MG PO TBDP
ORAL_TABLET | ORAL | Status: DC
Start: 2014-11-05 — End: 2015-02-11

## 2014-11-05 MED ORDER — OXYCODONE-ACETAMINOPHEN 5-325 MG PO TABS: 1.0000 | ORAL_TABLET | ORAL | Status: DC | PRN

## 2014-11-05 MED ORDER — TAMSULOSIN HCL 0.4 MG PO CAPS: 0.4000 mg | ORAL_CAPSULE | Freq: Two times a day (BID) | ORAL | Status: DC

## 2014-11-05 MED ORDER — HYDROMORPHONE HCL 1 MG/ML IJ SOLN
1.0000 mg | Freq: Once | INTRAMUSCULAR | Status: AC
  Administered 2014-11-05: 1 mg via INTRAVENOUS
  Filled 2014-11-05: qty 1

## 2014-11-05 NOTE — ED Provider Notes (Signed)
CSN: [id number]     Arrival date & time 11/05/14  1725 History   First MD Initiated Contact with Patient 11/05/14 1727     Chief Complaint  Patient presents with  . Flank Pain     (Consider location/radiation/quality/duration/timing/severity/associated sxs/prior Treatment) HPI Kimberly Riley is a 39 y.o. female with history of IBS, kidney stones, comes in for evaluation of right-sided flank pain. Patient states she started having right-sided flank pain on Wednesday, typical of her kidney stone pain. She reports passing a stone earlier today, but the pain remains. Pain is waxing and waning feels like typical kidney stone pain. She denies fevers, chills, vomiting, urinary symptoms, other abdominal pain, vaginal bleeding or discharge. She does report associated nausea. She denies any discomfort now in the ED. Symptoms sudden onset and persistent in nature. No other aggravating or modifying factors.  Past Medical History  Diagnosis Date  . IBS (irritable bowel syndrome)   . History of kidney stones   . Renal calculi     BILATERAL --- NON-OBSTRUCTIVE  . GERD (gastroesophageal reflux disease)   . PONV (postoperative nausea and vomiting)   . Irregular menstrual cycle   . Right ureteral stone    Past Surgical History  Procedure Laterality Date  . Essure tubal ligation  SEPT 2007  . Strabismus surgery  AS CHILD  . Cholecystectomy  1998  . Ureteroscopic laser litho/ stone extraction  1998  . Cytso/  right ureteroscopic laser lithotripsy stone extraction/  stent placement  05-20-2011  . Cystoscopy w/ retrogrades Bilateral 11/29/2013    Procedure: CYSTOSCOPY WITH RETROGRADE PYELOGRAM;  Surgeon: Valetta Fuller, MD;  Location: [facility];  Service: Urology;  Laterality: Bilateral;  . Cystoscopy with retrograde pyelogram, ureteroscopy and stent placement Left 11/29/2013    Procedure: CYSTOSCOPY WITH URETEROSCOPY AND STENT PLACEMENT;  Surgeon: Valetta Fuller, MD;  Location: [facility];  Service: Urology;  Laterality: Left;  . Cystoscopy with retrograde pyelogram, ureteroscopy and stent placement Right 04/21/2013    Procedure: CYSTOSCOPY WITH RIGHT RETROGRADE PYELOGRAM, RIGHT DIGITAL URETEROSCOPY AND RIGHT STENT PLACEMENT;  Surgeon: Valetta Fuller, MD;  Location[facility];  Service: Urology;  Laterality: Right;  . Holmium laser application Right 04/21/2013    Procedure: HOLMIUM LASER APPLICATION;  Surgeon: Valetta Fuller, MD;  Location: [facility];  Service: Urology;  Laterality: Right;  . Tubal ligation     History reviewed. No pertinent family history. Social History  Substance Use Topics  . Smoking status: Never Smoker   . Smokeless tobacco: Never Used  . Alcohol Use: 0.0 oz/week     Comment: rare   OB History    No data available     Review of Systems A 10 point review of systems was completed and was negative except for pertinent positives and negatives as mentioned in the history of present illness     Allergies  Penicillins  Home Medications   Prior to Admission medications   Medication Sig Start Date End Date Taking? Authorizing Provider  dicyclomine (BENTYL) 10 MG capsule Take 10 mg by mouth 3 (three) times daily as needed.     Historical Provider, MD  HYDROmorphone (DILAUDID) 2 MG tablet Take 2 mg by mouth every 4 (four) hours as needed for severe pain.    Historical Provider, MD  Meth-Hyo-M Bl-Na Phos-Ph Sal (URIBEL) 118 MG CAPS Take 1 capsule (118 mg total) by mouth 3 (three) times daily as needed. 11/29/13  Barron Alvine, MD  ondansetron [facility] ODT) 4 MG disintegrating tablet  ODT q4 hours prn nausea/vomit 11/05/14   Joycie Peek, PA-C  oxyCODONE-acetaminophen (PERCOCET/ROXICET) 5-325 MG tablet Take 1-2 tablets by mouth every 4 (four) hours as needed for severe pain. 11/05/14   Joycie Peek, PA-C  ranitidine (ZANTAC) 150 MG tablet Take 150 mg by mouth as needed for heartburn.    Historical  Provider, MD  tamsulosin (FLOMAX) 0.4 MG CAPS capsule Take 1 capsule (0.4 mg total) by mouth 2 (two) times daily. 11/05/14   Joycie Peek, PA-C   BP 110/78 mmHg  Pulse 88  Temp(Src) 97.8 F (36.6 C) (Oral)  Resp 20  Ht  (1.626 m)  Wt 205 lb (92.987 kg)  BMI 35.17 kg/m2  SpO2 100%  LMP 10/18/2014 Physical Exam  Constitutional: She is oriented to person, place, and time. She appears well-developed and well-nourished.  HENT:  Head: Normocephalic and atraumatic.  Mouth/Throat: Oropharynx is clear and moist.  Eyes: Conjunctivae are normal. Pupils are equal, round, and reactive to light. Right eye exhibits no discharge. Left eye exhibits no discharge. No scleral icterus.  Neck: Neck supple.  Cardiovascular: Normal rate, regular rhythm and normal heart sounds.   Pulmonary/Chest: Effort normal and breath sounds normal. No respiratory distress. She has no wheezes. She has no rales.  Abdominal: Soft.  Diffuse abdominal tenderness to palpation, which she states is baseline for her. Abdomen is otherwise soft, nondistended without any focal tenderness. No lesions or deformities.  Musculoskeletal: She exhibits no tenderness.  Neurological: She is alert and oriented to person, place, and time.  Cranial Nerves II-XII grossly intact  Skin: Skin is warm and dry. No rash noted.  Psychiatric: She has a normal mood and affect.  Nursing note and vitals reviewed.   ED Course  Procedures (including critical care time) Labs Review Labs Reviewed  COMPREHENSIVE METABOLIC PANEL - Abnormal; Notable for the following:    Potassium 3.2 (*)    CO2 21 (*)    Glucose, Bld 123 (*)    Calcium 8.6 (*)    All other components within normal limits  CBC WITH DIFFERENTIAL/PLATELET - Abnormal; Notable for the following:    WBC 13.6 (*)    Neutro Abs 8.1 (*)    Lymphs Abs 4.2 (*)    All other components within normal limits  URINALYSIS, ROUTINE W REFLEX MICROSCOPIC (NOT AT [facility]) - Abnormal; Notable for  the following:    APPearance CLOUDY (*)    Hgb urine dipstick MODERATE (*)    Leukocytes, UA SMALL (*)    All other components within normal limits  URINE MICROSCOPIC-ADD ON - Abnormal; Notable for the following:    Squamous Epithelial / LPF MANY (*)    Bacteria, UA MANY (*)    Casts HYALINE CASTS (*)    All other components within normal limits  URINE CULTURE  PREGNANCY, URINE    Imaging Review US Renal  11/05/2014   CLINICAL DATA:  Right flank pain, radiating to the bladder.  EXAM: RENAL / URINARY TRACT ULTRASOUND COMPLETE  COMPARISON:  None.  FINDINGS: Right Kidney:  Length: 12.4 cm. There is a moderate right hydronephrosis. The cortical thickness is normal.  Left Kidney:  Length: 12.8 cm. There is mild pelviectasis. Otherwise, there is normal cortical medullary differentiation.  Bladder:  Appears normal for degree of bladder distention. Bilateral ureteral jets are seen. There is an echogenic immobile focus at the right ureterovesicular junction which measures 0.9 x 0.3 x 1.3 cm.  This likely corresponds to the distal ureteral stone seen by recent CT.  IMPRESSION: Large calculus at the right ureterovesicular junction, with associated right hydronephrosis. There is however a preserved right ureteral jet, which argues against a complete right obstructive uropathy.  Mild left renal pelviectasis, otherwise normal appearance of the left kidney.   Electronically Signed   By: Ted Mcalpine M.D.   On: 11/05/2014 19:02   I have personally reviewed and evaluated these images and lab results as part of my medical decision-making.   EKG Interpretation None     Meds given in ED:  Medications  HYDROmorphone (DILAUDID) injection 1 mg (1 mg Intravenous Given 11/05/14 1746)  ondansetron (ZOFRAN) injection 4 mg (4 mg Intravenous Given 11/05/14 1746)    Discharge Medication List as of 11/05/2014  8:13 PM    START taking these medications   Details  oxyCODONE-acetaminophen (PERCOCET/ROXICET)  5-325 MG tablet Take 1-2 tablets by mouth every 4 (four) hours as needed for severe pain., Starting 11/05/2014, Until Discontinued, Print    tamsulosin (FLOMAX) 0.4 MG CAPS capsule Take 1 capsule (0.4 mg total) by mouth 2 (two) times daily., Starting 11/05/2014, Until Discontinued, Print       Filed Vitals:   11/05/14 1731 11/05/14 1800 11/05/14 1938 11/05/14 2018  BP: 121/83 129/77 123/79 110/78  Pulse: 95 101 110 88  Temp: 97.8 F (36.6 C)     TempSrc: Oral     Resp: Height:  (1.626 m)     Weight: 205 lb (92.987 kg)     SpO2: 99% 95% 95% 100%    MDM  Patient with history of kidney stones presents to the ED for right-sided flank pain similar to previous kidney stone pain.  Vitals stable - WNL -afebrile Original tachycardia improves after pain medicine. Pt resting comfortably in ED. PE--diffuse, right-sided flank pain. Benign abdominal exam. Physical exam otherwise unremarkable. Labwork--leukocytosis 13.6. Creatinine 0.89 Urinalysis with many bacteria, 3-6 white cells and many squamous epithelial cells. No evidence of overt UTI, however will obtain urine culture. Imaging-renal ultrasound shows right-sided hydronephrosis and ureteral stone at UVJ measuring 0.9 x 0.3 x 1.3 cm. There is a preserved right ureteral jet, which argues against complete obstructive uropathy. Patient remains afebrile, creatinine is normal, patient with evidence of stone, without any evidence of urosepsis or pyelonephritis. Discussed findings with patient that she will need follow-up with her urologist in the morning for reevaluation, patient verbalizes understanding.  I discussed all relevant lab findings and imaging results with pt and they verbalized understanding. Discussed f/u with PCP within 48 hrs and return precautions, pt very amenable to plan. Prior to patient discharge, I discussed and reviewed this case with Dr.James   Final diagnoses:  Right ureteral calculus        Joycie Peek, PA-C 11/05/14 2105  Rolland Porter, MD 11/05/14 2241

## 2014-11-05 NOTE — Discharge Instructions (Signed)
You were evaluated in the ED today for your right flank pain. Your found to have a large ureteral stone. It is important to call your urologist in the morning for reevaluation. Please take your medications as prescribed. Return to ED for worsening symptoms.  Kidney Stones Kidney stones (urolithiasis) are deposits that form inside your kidneys. The intense pain is caused by the stone moving through the urinary tract. When the stone moves, the ureter goes into spasm around the stone. The stone is usually passed in the urine.  CAUSES   A disorder that makes certain neck glands produce too much parathyroid hormone (primary hyperparathyroidism).  A buildup of uric acid crystals, similar to gout in your joints.  Narrowing (stricture) of the ureter.  A kidney obstruction present at birth (congenital obstruction).  Previous surgery on the kidney or ureters.  Numerous kidney infections. SYMPTOMS   Feeling sick to your stomach (nauseous).  Throwing up (vomiting).  Blood in the urine (hematuria).  Pain that usually spreads (radiates) to the groin.  Frequency or urgency of urination. DIAGNOSIS   Taking a history and physical exam.  Blood or urine tests.  CT scan.  Occasionally, an examination of the inside of the urinary bladder (cystoscopy) is performed. TREATMENT   Observation.  Increasing your fluid intake.  Extracorporeal shock wave lithotripsy--This is a noninvasive procedure that uses shock waves to break up kidney stones.  Surgery may be needed if you have severe pain or persistent obstruction. There are various surgical procedures. Most of the procedures are performed with the use of small instruments. Only small incisions are needed to accommodate these instruments, so recovery time is minimized. The size, location, and chemical composition are all important variables that will determine the proper choice of action for you. Talk to your health care provider to better  understand your situation so that you will minimize the risk of injury to yourself and your kidney.  HOME CARE INSTRUCTIONS   Drink enough water and fluids to keep your urine clear or pale yellow. This will help you to pass the stone or stone fragments.  Strain all urine through the provided strainer. Keep all particulate matter and stones for your health care provider to see. The stone causing the pain may be as small as a grain of salt. It is very important to use the strainer each and every time you pass your urine. The collection of your stone will allow your health care provider to analyze it and verify that a stone has actually passed. The stone analysis will often identify what you can do to reduce the incidence of recurrences.  Only take over-the-counter or prescription medicines for pain, discomfort, or fever as directed by your health care provider.  Keep all follow-up visits as told by your health care provider. This is important.  Get follow-up X-rays if required. The absence of pain does not always mean that the stone has passed. It may have only stopped moving. If the urine remains completely obstructed, it can cause loss of kidney function or even complete destruction of the kidney. It is your responsibility to make sure X-rays and follow-ups are completed. Ultrasounds of the kidney can show blockages and the status of the kidney. Ultrasounds are not associated with any radiation and can be performed easily in a matter of minutes.  Make changes to your daily diet as told by your health care provider. You may be told to:  Limit the amount of salt that you eat.  Eat  5 or more servings of fruits and vegetables each day.  Limit the amount of meat, poultry, fish, and eggs that you eat.  Collect a 24-hour urine sample as told by your health care provider.You may need to collect another urine sample every 6-12 months. SEEK MEDICAL CARE IF:  You experience pain that is progressive and  unresponsive to any pain medicine you have been prescribed. SEEK IMMEDIATE MEDICAL CARE IF:   Pain cannot be controlled with the prescribed medicine.  You have a fever or shaking chills.  The severity or intensity of pain increases over 18 hours and is not relieved by pain medicine.  You develop a new onset of abdominal pain.  You feel faint or pass out.  You are unable to urinate.   This information is not intended to replace advice given to you by your health care provider. Make sure you discuss any questions you have with your health care provider.   Document Released: 01/14/2005 Document Revised: 10/05/2014 Document Reviewed: 06/17/2012 Elsevier Interactive Patient Education [employer]

## 2014-11-05 NOTE — ED Notes (Signed)
Patient has passed a kidney stone today and is having continued flank pain  - right side.

## 2014-11-22 ENCOUNTER — Ambulatory Visit (INDEPENDENT_AMBULATORY_CARE_PROVIDER_SITE_OTHER): Payer: 59 | Admitting: Podiatry

## 2014-11-22 VITALS — BP 112/77 | HR 92 | Resp 16 | Ht 64.0 in | Wt 205.0 lb

## 2014-11-22 DIAGNOSIS — L600 Ingrowing nail: Secondary | ICD-10-CM

## 2014-11-22 MED ORDER — NEOMYCIN-POLYMYXIN-HC 3.5-10000-1 OP SUSP
OPHTHALMIC | Status: DC
Start: 2014-11-22 — End: 2015-02-11

## 2014-11-22 NOTE — Progress Notes (Signed)
   Subjective:    Patient ID: Kimberly Riley, female    DOB: 12/18/1975, 39 y.o.   MRN: [id number]  HPI Patient presents with an ingrown toenail in their right foot, great toe-lateral side. Right foot. x2 years   Review of Systems  All other systems reviewed and are negative.      Objective:   Physical Exam        Assessment & Plan:

## 2014-11-22 NOTE — Patient Instructions (Signed)

## 2014-11-23 NOTE — Progress Notes (Signed)
Subjective:     Patient ID: Kimberly Riley, female   DOB: 06-Jul-1975, 39 y.o.   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      Objective:   Physical Exam  Constitutional: She is oriented to person, place, and time.  Cardiovascular: Intact distal pulses.   Musculoskeletal: Normal range of motion.  Neurological: She is oriented to person, place, and time.  Skin: Skin is warm.  Nursing note and vitals reviewed.  neurovascular status found to be intact muscle strength adequate range of motion within normal limits with patient having incurvation of the right hallux lateral border that's very painful when pressed with distal redness but no current drainage noted. Patient has good digital perfusion and is well oriented 3 with no equinus condition     Assessment:     Ingrown toenail deformity right hallux lateral border that's painful when pressed    Plan:     H&P and condition reviewed with patient. I've recommended removal of the nail border and patient wants surgery and I explained procedure to patient and risk. She understands this wants the procedure and today I infiltrated 60 mg Xylocaine Marcaine mixture remove the lateral border exposed matrix and applied phenol 3 applications 30 seconds followed by alcohol lavage and sterile dressing. Gave instructions on soaks and reappoint

## 2014-11-25 ENCOUNTER — Telehealth: Payer: Self-pay | Admitting: *Deleted

## 2014-11-25 NOTE — Telephone Encounter (Signed)
Left message for patient at [phone] (Cell #) to check to see how they were feeling from their ingrown toenail that was performed on Tuesday. November 22, 2014. Waiting for a response.

## 2015-01-30 ENCOUNTER — Ambulatory Visit (INDEPENDENT_AMBULATORY_CARE_PROVIDER_SITE_OTHER): Payer: 59 | Admitting: Emergency Medicine

## 2015-01-30 VITALS — BP 100/70 | HR 86 | Temp 97.9°F | Resp 16 | Ht 63.75 in | Wt 213.4 lb

## 2015-01-30 DIAGNOSIS — J0140 Acute pansinusitis, unspecified: Secondary | ICD-10-CM | POA: Diagnosis not present

## 2015-01-30 MED ORDER — CEFPROZIL 250 MG PO TABS: 250.0000 mg | ORAL_TABLET | Freq: Two times a day (BID) | ORAL | Status: DC

## 2015-01-30 MED ORDER — PSEUDOEPHEDRINE-GUAIFENESIN ER 60-600 MG PO TB12: 1.0000 | ORAL_TABLET | Freq: Two times a day (BID) | ORAL | Status: DC

## 2015-01-30 NOTE — Patient Instructions (Signed)

## 2015-01-30 NOTE — Progress Notes (Signed)
Subjective:  Patient ID: Kimberly Riley, female    DOB: [birth year]  Age: 40 y.o. MRN: [id number]  CC: Sore Throat   HPI Kimberly Riley presents   Patient has nasal congestion nasal discharge or postnasal drip. She has purulent nasal drainage. She has no sore throat. No pain in her ears. She has no cough wheezing or shortness of breath. She has no nausea vomiting or stool change. She has no fever chills she's been unable to control her symptoms with over-the-counter medication  History [name] has a past medical history of IBS (irritable bowel syndrome); History of kidney stones; Renal calculi; GERD (gastroesophageal reflux disease); PONV (postoperative nausea and vomiting); Irregular menstrual cycle; Right ureteral stone; and Depression.   She has past surgical history that includes Essure tubal ligation (SEPT 2007); Strabismus surgery (AS CHILD); Cholecystectomy (1998); URETEROSCOPIC LASER LITHO/ STONE EXTRACTION (1998); CYTSO/  RIGHT URETEROSCOPIC LASER LITHOTRIPSY STONE EXTRACTION/  STENT PLACEMENT (05-20-2011); Cystoscopy w/ retrogrades (Bilateral, 11/29/2013); Cystoscopy with retrograde pyelogram, ureteroscopy and stent placement (Left, 11/29/2013); Cystoscopy with retrograde pyelogram, ureteroscopy and stent placement (Right, 04/21/2013); Holmium laser application (Right, 04/21/2013); and Tubal ligation.   Her  family history is not on file.  She   reports that she has never smoked. She has never used smokeless tobacco. She reports that she drinks alcohol. She reports that she does not use illicit drugs.  Outpatient Prescriptions Prior to Visit  Medication Sig Dispense Refill  . dicyclomine (BENTYL) 10 MG capsule Take 10 mg by mouth 3 (three) times daily as needed.     . neomycin-polymyxin-hydrocortisone (CORTISPORIN) 3.5-10000-1 ophthalmic suspension Apply 1-2 drops to the affected toes BID after soaking (Patient not taking: Reported on 01/30/2015) 7.5 mL 1  . ondansetron (ZOFRAN ODT) 4 MG  disintegrating tablet 4mg  ODT q4 hours prn nausea/vomit (Patient not taking: Reported on 01/30/2015) 20 tablet 0  . ranitidine (ZANTAC) 150 MG tablet Take 150 mg by mouth as needed for heartburn. Reported on 01/30/2015    . tamsulosin (FLOMAX) 0.4 MG CAPS capsule Take 1 capsule (0.4 mg total) by mouth 2 (two) times daily. (Patient not taking: Reported on 01/30/2015) 10 capsule 0   No facility-administered medications prior to visit.    Social History   Social History  . Marital Status: Legally Separated    Spouse Name: N/A  . Number of Children: N/A  . Years of Education: N/A   Social History Main Topics  . Smoking status: Never Smoker   . Smokeless tobacco: Never Used  . Alcohol Use: 0.0 oz/week     Comment: rare  . Drug Use: No  . Sexual Activity: Not Asked   Other Topics Concern  . None   Social History Narrative     Review of Systems  Constitutional: Positive for fatigue. Negative for fever, chills and appetite change.  HENT: Positive for postnasal drip and sore throat. Negative for congestion, ear pain and sinus pressure.   Eyes: Negative for pain and redness.  Respiratory: Positive for cough. Negative for shortness of breath and wheezing.   Cardiovascular: Negative for leg swelling.  Gastrointestinal: Negative for nausea, vomiting, abdominal pain, diarrhea, constipation and blood in stool.  Endocrine: Negative for polyuria.  Genitourinary: Negative for dysuria, urgency, frequency and flank pain.  Musculoskeletal: Negative for gait problem.  Skin: Negative for rash.  Neurological: Negative for weakness and headaches.  Psychiatric/Behavioral: Negative for confusion and decreased concentration. The patient is not nervous/anxious.     Objective:  BP 100/70 mmHg  Pulse  86  Temp(Src) 97.9 F (36.6 C) (Oral)  Resp 16  Ht 5' 3.75" (1.619 m)  Wt 213 lb 6.4 oz (96.798 kg)  BMI 36.93 kg/m2  SpO2 97%  Physical Exam  Constitutional: She is oriented to person, place, and  time. She appears well-developed and well-nourished. No distress.  HENT:  Head: Normocephalic and atraumatic.  Right Ear: External ear normal.  Left Ear: External ear normal.  Nose: Nose normal.  Eyes: Conjunctivae and EOM are normal. Pupils are equal, round, and reactive to light. No scleral icterus.  Neck: Normal range of motion. Neck supple. No tracheal deviation present.  Cardiovascular: Normal rate, regular rhythm and normal heart sounds.   Pulmonary/Chest: Effort normal. No respiratory distress. She has no wheezes. She has no rales.  Abdominal: She exhibits no mass. There is no tenderness. There is no rebound and no guarding.  Musculoskeletal: She exhibits no edema.  Lymphadenopathy:    She has no cervical adenopathy.  Neurological: She is alert and oriented to person, place, and time. Coordination normal.  Skin: Skin is warm and dry. No rash noted.  Psychiatric: She has a normal mood and affect. Her behavior is normal.      Assessment & Plan:   Clarene Critchleyve was seen today for sore throat.  Diagnoses and all orders for this visit:  Acute pansinusitis, recurrence not specified  Other orders -     pseudoephedrine-guaifenesin (MUCINEX D) 60-600 MG 12 hr tablet; Take 1 tablet by mouth every 12 (twelve) hours. -     cefPROZIL (CEFZIL) 250 MG tablet; Take 1 tablet (250 mg total) by mouth 2 (two) times daily.  I am having [name] start on pseudoephedrine-guaifenesin and cefPROZIL. I am also having her maintain her dicyclomine, ranitidine, ondansetron, tamsulosin, neomycin-polymyxin-hydrocortisone, and Norgestimate-Ethinyl Estradiol Triphasic.  Meds ordered this encounter  Medications  . Norgestimate-Ethinyl Estradiol Triphasic (ORTHO TRI-CYCLEN LO) 0.18/0.215/0.25 MG-25 MCG tab    Sig: Take 1 tablet by mouth daily.  . pseudoephedrine-guaifenesin (MUCINEX D) 60-600 MG 12 hr tablet    Sig: Take 1 tablet by mouth every 12 (twelve) hours.    Dispense:  18 tablet    Refill:  0  .  cefPROZIL (CEFZIL) 250 MG tablet    Sig: Take 1 tablet (250 mg total) by mouth 2 (two) times daily.    Dispense:  20 tablet    Refill:  0    Appropriate red flag conditions were discussed with the patient as well as actions that should be taken.  Patient expressed his understanding.  Follow-up: Return if symptoms worsen or fail to improve.  Carmelina DaneAnderson, Jeffery S, MD

## 2015-02-02 ENCOUNTER — Telehealth: Payer: Self-pay

## 2015-02-02 NOTE — Telephone Encounter (Signed)
Patient is calling because she was recently prescribed an antibiotic and it has been giving her cough spells and vomiting. Please advise!

## 2015-02-03 NOTE — Telephone Encounter (Signed)
Dr. Is off can someone else handle this request [facility] on [address]   Allergic to penicillan.    Throat is starting to hurt again, cough is better    [phone]

## 2015-02-03 NOTE — Telephone Encounter (Signed)
Any suggestions>?

## 2015-02-05 ENCOUNTER — Encounter: Payer: Self-pay | Admitting: Emergency Medicine

## 2015-02-06 ENCOUNTER — Other Ambulatory Visit: Payer: Self-pay | Admitting: Emergency Medicine

## 2015-02-06 MED ORDER — LEVOFLOXACIN 500 MG PO TABS: 500.0000 mg | ORAL_TABLET | Freq: Every day | ORAL | Status: AC

## 2015-02-06 NOTE — Addendum Note (Signed)
Addended by: Carmelina DaneANDERSON, JEFFERY S on: 02/06/2015 10:29 AM   Modules accepted: Kipp BroodSmartSet

## 2015-02-08 MED ORDER — HYDROCODONE-HOMATROPINE 5-1.5 MG/5ML PO SYRP: 5.0000 mL | ORAL_SOLUTION | Freq: Three times a day (TID) | ORAL | Status: DC | PRN

## 2015-02-08 NOTE — Telephone Encounter (Signed)
Levaquin should not cause a cough - but she could be having a cough from her illness.  I am happy to give her some cough medication if she would like that.  I would have her use mucinex for congestion to get better faster.  I will have a Rx for the cough medication printed if she wants it and if she does not please let me know what else I can do for her.

## 2015-02-08 NOTE — Telephone Encounter (Signed)
I sent a MyChart message to pt advising that we also have a cough med ready for p/up if she would like it.

## 2015-02-08 NOTE — Telephone Encounter (Signed)
She spoke with Dr. Dareen PianoAnderson through email.

## 2015-02-10 MED FILL — TRI-LO-ESTARYLLA TABLET: 0.18/0.215/ | 84 days supply | Qty: 84 | Fill #1

## 2015-02-11 ENCOUNTER — Emergency Department (HOSPITAL_BASED_OUTPATIENT_CLINIC_OR_DEPARTMENT_OTHER): Payer: 59

## 2015-02-11 ENCOUNTER — Emergency Department (HOSPITAL_BASED_OUTPATIENT_CLINIC_OR_DEPARTMENT_OTHER)
Admission: EM | Admit: 2015-02-11 | Discharge: 2015-02-11 | Disposition: A | Discharge status: Home / Self Care | Payer: 59 | Attending: Emergency Medicine | Admitting: Emergency Medicine

## 2015-02-11 ENCOUNTER — Encounter (HOSPITAL_BASED_OUTPATIENT_CLINIC_OR_DEPARTMENT_OTHER): Payer: Self-pay | Admitting: Emergency Medicine

## 2015-02-11 DIAGNOSIS — Z8742 Personal history of other diseases of the female genital tract: Secondary | ICD-10-CM | POA: Diagnosis not present

## 2015-02-11 DIAGNOSIS — K219 Gastro-esophageal reflux disease without esophagitis: Secondary | ICD-10-CM | POA: Insufficient documentation

## 2015-02-11 DIAGNOSIS — Z8659 Personal history of other mental and behavioral disorders: Secondary | ICD-10-CM | POA: Diagnosis not present

## 2015-02-11 DIAGNOSIS — Z880 Allergy status to penicillin: Secondary | ICD-10-CM | POA: Insufficient documentation

## 2015-02-11 DIAGNOSIS — Z79899 Other long term (current) drug therapy: Secondary | ICD-10-CM | POA: Diagnosis not present

## 2015-02-11 DIAGNOSIS — N132 Hydronephrosis with renal and ureteral calculous obstruction: Secondary | ICD-10-CM | POA: Diagnosis not present

## 2015-02-11 DIAGNOSIS — Z9851 Tubal ligation status: Secondary | ICD-10-CM | POA: Diagnosis not present

## 2015-02-11 DIAGNOSIS — Z87442 Personal history of urinary calculi: Secondary | ICD-10-CM | POA: Insufficient documentation

## 2015-02-11 DIAGNOSIS — N201 Calculus of ureter: Secondary | ICD-10-CM | POA: Insufficient documentation

## 2015-02-11 DIAGNOSIS — R109 Unspecified abdominal pain: Secondary | ICD-10-CM | POA: Diagnosis present

## 2015-02-11 DIAGNOSIS — N3001 Acute cystitis with hematuria: Secondary | ICD-10-CM | POA: Diagnosis not present

## 2015-02-11 LAB — COMPREHENSIVE METABOLIC PANEL
ALBUMIN: 4 g/dL (ref 3.5–5.0)
ALT: 16 U/L (ref 14–54)
AST: 19 U/L (ref 15–41)
Alkaline Phosphatase: 47 U/L (ref 38–126)
Anion gap: 9 (ref 5–15)
BUN: 13 mg/dL (ref 6–20)
CHLORIDE: 110 mmol/L (ref 101–111)
CO2: 21 mmol/L — AB (ref 22–32)
Calcium: 8.8 mg/dL — ABNORMAL LOW (ref 8.9–10.3)
Creatinine, Ser: 0.79 mg/dL (ref 0.44–1.00)
GFR calc Af Amer: >60 mL/min (ref >60)
Glucose, Bld: 111 mg/dL — ABNORMAL HIGH (ref 65–99)
POTASSIUM: 3.9 mmol/L (ref 3.5–5.1)
SODIUM: 140 mmol/L (ref 135–145)
Total Bilirubin: 0.5 mg/dL (ref 0.3–1.2)
Total Protein: 7.4 g/dL (ref 6.5–8.1)

## 2015-02-11 LAB — CBC WITH DIFFERENTIAL/PLATELET
BASOS ABS: 0.1 10*3/uL (ref 0.0–0.1)
BASOS PCT: 1 %
EOS ABS: 0.2 10*3/uL (ref 0.0–0.7)
EOS PCT: 1 %
HCT: 41.1 % (ref 36.0–46.0)
Hemoglobin: 14 g/dL (ref 12.0–15.0)
Lymphocytes Relative: 21 %
Lymphs Abs: 3.6 10*3/uL (ref 0.7–4.0)
MCH: 30.4 pg (ref 26.0–34.0)
MCHC: 34.1 g/dL (ref 30.0–36.0)
MCV: 89.3 fL (ref 78.0–100.0)
MONO ABS: 0.9 10*3/uL (ref 0.1–1.0)
Monocytes Relative: 5 %
Neutro Abs: 12 10*3/uL — ABNORMAL HIGH (ref 1.7–7.7)
Neutrophils Relative %: 72 %
PLATELETS: 333 10*3/uL (ref 150–400)
RBC: 4.6 MIL/uL (ref 3.87–5.11)
RDW: 12.7 % (ref 11.5–15.5)
WBC: 16.8 10*3/uL — ABNORMAL HIGH (ref 4.0–10.5)

## 2015-02-11 LAB — URINE MICROSCOPIC-ADD ON

## 2015-02-11 LAB — URINALYSIS, ROUTINE W REFLEX MICROSCOPIC
Bilirubin Urine: NEGATIVE
Glucose, UA: NEGATIVE mg/dL
Ketones, ur: NEGATIVE mg/dL
Nitrite: NEGATIVE
Protein, ur: NEGATIVE mg/dL
SPECIFIC GRAVITY, URINE: 1.009 (ref 1.005–1.030)
pH: 6 (ref 5.0–8.0)

## 2015-02-11 MED ORDER — ONDANSETRON HCL 4 MG/2ML IJ SOLN
INTRAMUSCULAR | Status: AC
  Administered 2015-02-11: 4 mg via INTRAVENOUS
  Filled 2015-02-11: qty 2

## 2015-02-11 MED ORDER — FENTANYL CITRATE (PF) 100 MCG/2ML IJ SOLN
INTRAMUSCULAR | Status: AC
  Filled 2015-02-11: qty 2

## 2015-02-11 MED ORDER — OXYCODONE-ACETAMINOPHEN 5-325 MG PO TABS: 1.0000 | ORAL_TABLET | Freq: Four times a day (QID) | ORAL | Status: DC | PRN

## 2015-02-11 MED ORDER — FLUCONAZOLE 150 MG PO TABS: 150.0000 mg | ORAL_TABLET | Freq: Every day | ORAL | Status: DC

## 2015-02-11 MED ORDER — CEFTRIAXONE SODIUM 1 G IJ SOLR
INTRAMUSCULAR | Status: AC
  Filled 2015-02-11: qty 10

## 2015-02-11 MED ORDER — NAPROXEN 500 MG PO TABS
500.0000 mg | ORAL_TABLET | Freq: Two times a day (BID) | ORAL | Status: DC
Start: 2015-02-11 — End: 2015-02-23

## 2015-02-11 MED ORDER — KETOROLAC TROMETHAMINE 30 MG/ML IJ SOLN
30.0000 mg | Freq: Once | INTRAMUSCULAR | Status: AC
  Administered 2015-02-11: 30 mg via INTRAVENOUS
  Filled 2015-02-11: qty 1

## 2015-02-11 MED ORDER — CIPROFLOXACIN HCL 500 MG PO TABS: 500.0000 mg | ORAL_TABLET | Freq: Two times a day (BID) | ORAL | Status: DC

## 2015-02-11 MED ORDER — FENTANYL CITRATE (PF) 100 MCG/2ML IJ SOLN
100.0000 ug | Freq: Once | INTRAMUSCULAR | Status: AC
  Administered 2015-02-11: 100 ug via INTRAVENOUS

## 2015-02-11 MED ORDER — DEXTROSE 5 % IV SOLN
1.0000 g | Freq: Once | INTRAVENOUS | Status: AC
  Administered 2015-02-11: 1 g via INTRAVENOUS

## 2015-02-11 MED ORDER — ONDANSETRON 4 MG PO TBDP: 4.0000 mg | ORAL_TABLET | Freq: Three times a day (TID) | ORAL | Status: DC | PRN

## 2015-02-11 MED ORDER — ONDANSETRON HCL 4 MG/2ML IJ SOLN: 4.0000 mg | Freq: Once | INTRAMUSCULAR | Status: DC

## 2015-02-11 MED ORDER — HYDROMORPHONE HCL 2 MG PO TABS: 2.0000 mg | ORAL_TABLET | Freq: Four times a day (QID) | ORAL | Status: DC | PRN

## 2015-02-11 MED ORDER — ONDANSETRON HCL 4 MG/2ML IJ SOLN
4.0000 mg | Freq: Once | INTRAMUSCULAR | Status: AC
Start: 2015-02-11 — End: 2015-02-11
  Administered 2015-02-11: 4 mg via INTRAVENOUS

## 2015-02-11 MED ORDER — TAMSULOSIN HCL 0.4 MG PO CAPS: 0.4000 mg | ORAL_CAPSULE | Freq: Every day | ORAL | Status: DC

## 2015-02-11 NOTE — ED Provider Notes (Signed)
CSN: [id number]     Arrival date & time 02/11/15  1657 History   First MD Initiated Contact with Patient 02/11/15 1840     Chief Complaint  Patient presents with  . Flank Pain     (Consider location/radiation/quality/duration/timing/severity/associated sxs/prior Treatment) HPI Comments: Patient with history of kidney stones presents with right-sided flank pain onset 5 AM. Patient has had associated vomiting. Pain radiates around to her lower abdomen and groin. Onset of symptoms was acute and abrupt. Symptoms are similar to previous kidney stones. Patient has required surgery and stenting in the past for kidney stones. No treatments prior to arrival. Course is constant. Nothing makes symptoms better or worse.  Patient is a 40 y.o. female presenting with flank pain. The history is provided by the patient.  Flank Pain Pertinent negatives include no abdominal pain, chest pain, coughing, fever, headaches, myalgias, nausea, rash, sore throat or vomiting.    Past Medical History  Diagnosis Date  . IBS (irritable bowel syndrome)   . History of kidney stones   . Renal calculi     BILATERAL --- NON-OBSTRUCTIVE  . GERD (gastroesophageal reflux disease)   . PONV (postoperative nausea and vomiting)   . Irregular menstrual cycle   . Right ureteral stone   . Depression    Past Surgical History  Procedure Laterality Date  . Essure tubal ligation  SEPT 2007  . Strabismus surgery  AS CHILD  . Cholecystectomy  1998  . Ureteroscopic laser litho/ stone extraction  1998  . Cytso/  right ureteroscopic laser lithotripsy stone extraction/  stent placement  05-20-2011  . Cystoscopy w/ retrogrades Bilateral 11/29/2013    Procedure: CYSTOSCOPY WITH RETROGRADE PYELOGRAM;  Surgeon: Valetta Fuller, MD;  [facility];  Service: Urology;  Laterality: Bilateral;  . Cystoscopy with retrograde pyelogram, ureteroscopy and stent placement Left 11/29/2013    Procedure: CYSTOSCOPY WITH  URETEROSCOPY AND STENT PLACEMENT;  Surgeon: Valetta Fuller, MD;  Location[facility];  Service: Urology;  Laterality: Left;  . Cystoscopy with retrograde pyelogram, ureteroscopy and stent placement Right 04/21/2013    Procedure: CYSTOSCOPY WITH RIGHT RETROGRADE PYELOGRAM, RIGHT DIGITAL URETEROSCOPY AND RIGHT STENT PLACEMENT;  Surgeon: Valetta Fuller, MD;  Location: [facility];  Service: Urology;  Laterality: Right;  . Holmium laser application Right 04/21/2013    Procedure: HOLMIUM LASER APPLICATION;  Surgeon: Valetta Fuller, MD;  Location: [facility];  Service: Urology;  Laterality: Right;  . Tubal ligation     History reviewed. No pertinent family history. Social History  Substance Use Topics  . Smoking status: Never Smoker   . Smokeless tobacco: Never Used  . Alcohol Use: 0.0 oz/week     Comment: rare   OB History    No data available     Review of Systems  Constitutional: Negative for fever.  HENT: Negative for rhinorrhea and sore throat.   Eyes: Negative for redness.  Respiratory: Negative for cough.   Cardiovascular: Negative for chest pain.  Gastrointestinal: Negative for nausea, vomiting, abdominal pain and diarrhea.  Genitourinary: Positive for flank pain. Negative for dysuria, frequency and hematuria.  Musculoskeletal: Negative for myalgias.  Skin: Negative for rash.  Neurological: Negative for headaches.      Allergies  Penicillins  Home Medications   Prior to Admission medications   Medication Sig Start Date End Date Taking? Authorizing Provider  dicyclomine (BENTYL) 10 MG capsule Take 10 mg by mouth 3 (three) times daily as needed.  Yes Historical Provider, MD  Norgestimate-Ethinyl Estradiol Triphasic (ORTHO TRI-CYCLEN LO) 0.18/0.215/0.25 MG-25 MCG tab Take 1 tablet by mouth daily.   Yes Historical Provider, MD  cefPROZIL (CEFZIL) 250 MG tablet Take 1 tablet (250 mg total) by mouth 2 (two) times daily. 01/30/15    Carmelina Dane, MD  HYDROcodone-homatropine [facility]) 5-1.5 MG/5ML syrup Take 5 mLs by mouth every 8 (eight) hours as needed for cough. 02/08/15   Morrell Riddle, PA-C  levofloxacin (LEVAQUIN) 500 MG tablet Take 1 tablet (500 mg total) by mouth daily. 02/06/15 02/16/15  Carmelina Dane, MD  neomycin-polymyxin-hydrocortisone (CORTISPORIN) 3.5-10000-1 ophthalmic suspension Apply 1-2 drops to the affected toes BID after soaking Patient not taking: Reported on 01/30/2015 11/22/14   Lenn Sink, DPM  ondansetron (ZOFRAN ODT) 4 MG disintegrating tablet 4mg  ODT q4 hours prn nausea/vomit Patient not taking: Reported on 01/30/2015 11/05/14   Joycie Peek, PA-C  pseudoephedrine-guaifenesin (MUCINEX D) 60-600 MG 12 hr tablet Take 1 tablet by mouth every 12 (twelve) hours. 01/30/15 01/30/16  Carmelina Dane, MD  ranitidine (ZANTAC) 150 MG tablet Take 150 mg by mouth as needed for heartburn. Reported on 01/30/2015    Historical Provider, MD  tamsulosin (FLOMAX) 0.4 MG CAPS capsule Take 1 capsule (0.4 mg total) by mouth 2 (two) times daily. Patient not taking: Reported on 01/30/2015 11/05/14   Joycie Peek, PA-C   BP 114/90 mmHg  Pulse 119  Temp(Src) 98.3 F (36.8 C) (Oral)  Resp 22  SpO2 100%  LMP 01/26/2015   Physical Exam  Constitutional: She appears well-developed and well-nourished. She appears distressed.  HENT:  Head: Normocephalic and atraumatic.  Eyes: Conjunctivae are normal. Right eye exhibits no discharge. Left eye exhibits no discharge.  Neck: Normal range of motion. Neck supple.  Cardiovascular: Normal rate, regular rhythm and normal heart sounds.   Pulmonary/Chest: Effort normal and breath sounds normal.  Abdominal: Soft. Bowel sounds are normal. There is no tenderness. There is no rebound and no guarding.  Neurological: She is alert.  Skin: Skin is warm and dry.  Psychiatric: She has a normal mood and affect.  Nursing note and vitals reviewed.   ED Course  Procedures  (including critical care time) Labs Review Labs Reviewed  CBC WITH DIFFERENTIAL/PLATELET - Abnormal; Notable for the following:    WBC 16.8 (*)    Neutro Abs 12.0 (*)    All other components within normal limits  COMPREHENSIVE METABOLIC PANEL - Abnormal; Notable for the following:    CO2 21 (*)    Glucose, Bld 111 (*)    Calcium 8.8 (*)    All other components within normal limits  URINALYSIS, ROUTINE W REFLEX MICROSCOPIC (NOT AT [facility]) - Abnormal; Notable for the following:    APPearance CLOUDY (*)    Hgb urine dipstick LARGE (*)    Leukocytes, UA SMALL (*)    All other components within normal limits  URINE MICROSCOPIC-ADD ON - Abnormal; Notable for the following:    Squamous Epithelial / LPF 0-5 (*)    Bacteria, UA MANY (*)    All other components within normal limits  URINE CULTURE    Imaging Review US Renal  02/11/2015  CLINICAL DATA:  Acute onset of right flank and lower abdominal pain. Nausea and vomiting. Initial encounter. EXAM: RENAL / URINARY TRACT ULTRASOUND COMPLETE COMPARISON:  Renal ultrasound performed 11/05/2014, and CT of the abdomen and pelvis performed 04/12/2014 FINDINGS: Right Kidney: Length: 13.2 cm. Echogenicity within normal limits. Mild-to-moderate right-sided hydronephrosis is noted.  Several stones are noted within the right kidney, measuring up to 1.1 cm in size. No mass seen. Left Kidney: Length: 13.4 cm. Echogenicity within normal limits. A 6 mm stone is noted at the interpole region of the left kidney. No mass or hydronephrosis visualized. Bladder: Partially distended. A left-sided ureteral jet is visualized. Note is made of a 9 x 6 mm stone at the mid to distal right ureter, approximately 8 cm above the right vesicoureteral junction. IMPRESSION: 1. Mild to moderate right-sided hydronephrosis, reflecting an obstructing 9 x 6 mm stone at the mid to distal right ureter, 8 cm above the right vesicoureteral junction. 2. Nonobstructing bilateral renal stones seen,  measuring up to 1.1 cm in size. Electronically Signed   By: Roanna RaiderJeffery  Chang M.D.   On: 02/11/2015 20:43   I have personally reviewed and evaluated these images and lab results as part of my medical decision-making.   EKG Interpretation None       7:51 PM Patient seen and examined. Work-up initiated. Medications ordered.   Vital signs reviewed and are as follows: BP 114/90 mmHg  Pulse 119  Temp(Src) 98.3 F (36.8 C) (Oral)  Resp 22  SpO2 100%  LMP 01/26/2015  9:46 PM Pain controlled. Reviewed work-up with Dr. Manus Gunningancour. Dose of Rocephin given here.   Reviewed case, pt history, lab findings, UA findings with Dr. Marlou PorchHerrick. Agrees patient does not need admission at this time, however would return with fever or uncontrolled symptoms. Encourages follow-up with Dr. Isabel CapriceGrapey in the office. He states he would probably not treat UTI unless culture is positive, however it would not be wrong to do so. Based on elevated WBC, clean catch urine with 6-30 WBC, I will give abx for home.   Urine cx sent.   Patient is comfortable. She is comfortable with this plan. Patient counseled on kidney stone treatment. Urged patient to strain urine and save any stones. Urged urology follow-up and [facility] with any complications. Counseled patient to maintain good fluid intake.   Counseled patient on use of Flomax.   Patient counseled on use of narcotic pain medications. Counseled not to combine these medications with others containing tylenol. Urged not to drink alcohol, drive, or perform any other activities that requires focus while taking these medications. The patient verbalizes understanding and agrees with the plan.   MDM   Final diagnoses:  Ureteral calculus  Acute cystitis with hematuria   Patient with ureteral calculus. Kidney function is normal. Suggestion of infection on UA and microscopic. Discussed with urology as above. Patient does not appear septic. Symptoms are controlled. Patient has  appropriate follow-up. After discussion with urology, feel that patient can have trial of outpatient therapy with close relative follow-up. Return instructions as above patient seems reliable to do so symptoms worsen.   Renne CriglerJoshua Elston Aldape, PA-C 02/11/15 2152  Glynn OctaveStephen Rancour, MD 02/11/15 2312

## 2015-02-11 NOTE — ED Notes (Signed)
Pt declined Zofran at this time. States she will call if she needs it.

## 2015-02-11 NOTE — Discharge Instructions (Signed)
Please read and follow all provided instructions.  Your diagnoses today include:  1. Ureteral calculus   2. Acute cystitis with hematuria     Tests performed today include:  Urine test/culture -- suggests infection in the urine  CT scan which showed a 6mm x 9mm millimeter kidney stone on the left side  Blood test that showed normal kidney function  Vital signs. See below for your results today.   Medications prescribed:   Percocet (oxycodone/acetaminophen) - narcotic pain medication  DO NOT drive or perform any activities that require you to be awake and alert because this medicine can make you drowsy. BE VERY CAREFUL not to take multiple medicines containing Tylenol (also called acetaminophen). Doing so can lead to an overdose which can damage your liver and cause liver failure and possibly death.   Naproxen - anti-inflammatory pain medication  Do not exceed 500mg  naproxen every 12 hours, take with food  You have been prescribed an anti-inflammatory medication or NSAID. Take with food. Take smallest effective dose for the shortest duration needed for your pain. Stop taking if you experience stomach pain or vomiting.    Ciprofloxacin - antibiotic  You have been prescribed an antibiotic medicine: take the entire course of medicine even if you are feeling better. Stopping early can cause the antibiotic not to work.   Zofran (ondansetron) - for nausea and vomiting   Flomax (tamsulosin) - relaxes smooth muscle to help kidney stones pass  Take any prescribed medications only as directed.  Home care instructions:  Follow any educational materials contained in this packet.  Please double your fluid intake for the next several days. Strain your urine and save any stones that may pass.   BE VERY CAREFUL not to take multiple medicines containing Tylenol (also called acetaminophen). Doing so can lead to an overdose which can damage your liver and cause liver failure and possibly  death.   Follow-up instructions: Please follow-up with your urologist or the urologist referral (provided on front page) in the next 3 days for further evaluation of your symptoms.  If you need to return to the Emergency Department, [facility] and [facility]. The [facility]rologists are [facility] at [facility] and can better care for you at this location.  Return instructions:  If you need to return to the Emergency Department, [facility] and [facility]. The [facility]rologists are located at [facility] and can better care for you at this location.   Please return to the Emergency Department if you experience worsening symptoms.  Please return if you develop fever or uncontrolled pain or vomiting.  Please return if you have any other emergent concerns.  Additional Information:  Your vital signs today were: BP 100/65 mmHg   Pulse 91   Temp(Src) 98.3 F (36.8 C) (Oral)   Resp 18   SpO2 98%   LMP 01/26/2015 If your blood pressure (BP) was elevated above 135/85 this visit, please have this repeated by your doctor within one month. --------------

## 2015-02-11 NOTE — ED Notes (Signed)
Pt with history of kidney stones, acute onset this am around 0500

## 2015-02-13 LAB — URINE CULTURE

## 2015-02-13 MED FILL — TAMSULOSIN HCL 0.4 MG CAP: 0.4 | 7 days supply | Qty: 7 | Fill #0

## 2015-02-13 MED FILL — FLUCONAZOLE 150 MG TABLET: 150 | 1 days supply | Qty: 1 | Fill #0

## 2015-02-14 DIAGNOSIS — R3129 Other microscopic hematuria: Secondary | ICD-10-CM | POA: Diagnosis not present

## 2015-02-14 DIAGNOSIS — N202 Calculus of kidney with calculus of ureter: Secondary | ICD-10-CM | POA: Diagnosis not present

## 2015-02-14 DIAGNOSIS — Z0000 Encounter for general adult medical examination without abnormal findings: Secondary | ICD-10-CM | POA: Diagnosis not present

## 2015-02-14 DIAGNOSIS — N1330 Unspecified hydronephrosis: Secondary | ICD-10-CM | POA: Diagnosis not present

## 2015-02-14 MED FILL — KETOROLAC 10 MG TABLET: 10 | 5 days supply | Qty: 20 | Fill #0

## 2015-02-15 DIAGNOSIS — N201 Calculus of ureter: Secondary | ICD-10-CM | POA: Diagnosis not present

## 2015-02-15 DIAGNOSIS — Z0000 Encounter for general adult medical examination without abnormal findings: Secondary | ICD-10-CM | POA: Diagnosis not present

## 2015-02-17 ENCOUNTER — Other Ambulatory Visit: Payer: Self-pay | Admitting: Urology

## 2015-02-21 ENCOUNTER — Encounter (HOSPITAL_BASED_OUTPATIENT_CLINIC_OR_DEPARTMENT_OTHER): Payer: Self-pay | Admitting: *Deleted

## 2015-02-23 ENCOUNTER — Encounter (HOSPITAL_BASED_OUTPATIENT_CLINIC_OR_DEPARTMENT_OTHER): Payer: Self-pay | Admitting: *Deleted

## 2015-02-23 MED FILL — TAMSULOSIN HCL 0.4 MG CAP: 0.4 | 20 days supply | Qty: 20 | Fill #0

## 2015-02-23 NOTE — Progress Notes (Signed)
NPO AFTER MN.  ARRIVE AT 1015.  CURRENT LAB RESULTS IN CHART AND EPIC.  WILL TAKE AM MEDS W/ SIPS OF WATER DOS INCLUDING ZANTAC AND IF NEEDED TAKE PAIN/ NAUSEA RX.

## 2015-02-27 NOTE — H&P (Signed)
History of Present Illness   Kimberly Riley presents today for a followup. She is here specifically to discuss potential treatment options for her newly diagnosed distal 9 x 6 mm ureteral calculus. She has a prior history of nephrolithiasis and she required intervention with a ureteroscopy on 2 occasions. She has had bilateral ureteral calculi on at least 1 occasion. She does have a track record of passing some fairly sizeable ureteral stones in the past including a 7 mm stone on 2 occasions from her right side. I last saw her a little less than a year ago at which point we diagnosed her with a 7 mm distal right ureteral stone. She was able to pass that stone spontaneously and we ended up canceling some surgery that had been planned. She has had some discomfort now on and off for a couple of weeks. This past weekend she developed much more severe pain and presented to the emergency room. There an ultrasound was done which showed some hydronephrosis and what appeared to be a relatively large stone in the distal ureter. She came in yesterday to see Diane. A KUB was obtained which I have reviewed. I have reviewed all of her recent studies and notes from the hospital system. Her KUB showed a 6 x 9 mm stone in the distal right ureter. Urinalysis showed some microhematuria. No evidence of infection. Yesterday she was having quite a bit of discomfort but is doing much better today. Her urinalysis today continues to show microscopic hematuria. There is no evidence of any obvious infection. Again, clinically she is much better at this time.      Past Medical History Problems  1. History of Bilateral ureteral calculi (N20.1) 2. History of depression (Z86.59) 3. History of Irritable bowel syndrome with diarrhea (K58.0)  Surgical History Problems  1. History of Cholecystectomy 2. History of Cystoscopy With Insertion Of Ureteral Stent Left 3. History of Cystoscopy With Insertion Of Ureteral Stent Right 4. History of  Cystoscopy With Insertion Of Ureteral Stent Right 5. History of Cystoscopy With Ureteroscopy With Lithotripsy 6. History of Cystoscopy With Ureteroscopy With Lithotripsy 7. History of Cystoscopy With Ureteroscopy With Removal Of Calculus 8. History of Eye Surgery 9. History of Foot Surgery  Current Meds 1. Bentyl TABS;  Therapy: (Recorded:15Mar2016) to Recorded 2. HYDROmorphone HCl - 2 MG Oral Tablet; TAKE 1 TABLET EVERY 4 TO 6 HOURS AS  NEEDED FOR PAIN;  Therapy: 27Oct2015 to (Evaluate:20Mar2016); Last Rx:15Mar2016 Ordered 3. Ketorolac Tromethamine 10 MG Oral Tablet; Take 1 tablet every 6 hours;  Therapy: 17Jan2017 to (Complete:22Jan2017)  Requested for: 17Jan2017; Last  Rx:17Jan2017 Ordered 4. Tamsulosin HCl - 0.4 MG Oral Capsule; Take 1 capsule by mouth every day; Last  Rx:15Mar2016 Ordered  Allergies Medication  1. Penicillins  Family History Problems  1. Family history of Family Health Status - Father's Age : Father   55yrs 2. Family history of Family Health Status - Mother's Age : Mother   53yrs 3. Family history of Family Health Status Number Of Children   2 sons 4. Family history of Nephrolithiasis  Social History Problems  1. Alcohol Use (History)   1 qd 2. Caffeine Use   2 qd 3. Marital History - Separated 4. Never A Smoker 5. Occupation:   Fish farm manager 6. Denied: History of Tobacco Use  Review of Systems Genitourinary, constitutional, skin, eye, otolaryngeal, hematologic/lymphatic, cardiovascular, pulmonary, endocrine, musculoskeletal, gastrointestinal, neurological and psychiatric system(s) were reviewed and pertinent findings if present are noted and are otherwise negative.  Gastrointestinal: nausea, flank pain and abdominal pain, but no vomiting.  Constitutional: feeling tired (fatigue), but no fever.  Musculoskeletal: back pain.  Neurological: dizziness.    Vitals Vital Signs [Data Includes: Last 1 Day]  Recorded: 18Jan2017 09:34AM   Blood Pressure: 109 / 74 Temperature: 98.3 F Heart Rate: 103 Recorded: 17Jan2017 11:12AM  Height: 5 ft 4 in Weight: 192 lb  BMI Calculated: 32.96 BSA Calculated: 1.92 Blood Pressure: 129 / 86 Temperature: 97.4 F Heart Rate: 99  Physical Exam Constitutional: Well nourished and well developed . No acute distress.  Pulmonary: No respiratory distress and normal respiratory rhythm and effort.  Cardiovascular: Heart rate and rhythm are normal . No peripheral edema.  Abdomen: The abdomen is soft and nontender. No masses are palpated. No CVA tenderness. No hernias are palpable. No hepatosplenomegaly noted.    Results/Data Urine [Data Includes: Last 1 Day]   18Jan2017  COLOR AMBER   APPEARANCE CLEAR   SPECIFIC GRAVITY 1.025   pH 5.5   GLUCOSE NEGATIVE   BILIRUBIN NEGATIVE   KETONE NEGATIVE   BLOOD 3+   PROTEIN TRACE   NITRITE NEGATIVE   LEUKOCYTE ESTERASE TRACE   SQUAMOUS EPITHELIAL/HPF 0-5 HPF  WBC 0-5 WBC/HPF  RBC 3-10 RBC/HPF  BACTERIA NONE SEEN HPF  CRYSTALS See Below HPF  CASTS NONE SEEN LPF  Yeast NONE SEEN HPF   Assessment Assessed  1. Right ureteral stone (N20.1)  Plan Health Maintenance  1. UA With REFLEX; [Do Not Release]; Status:Resulted - Requires Verification;   Done:  18Jan2017 09:25AM Right ureteral stone  2. Start: Tamsulosin HCl - 0.4 MG Oral Capsule; TAKE 1 CAPSULE EVERY DAY 3. Follow-up Schedule Surgery Office  Follow-up  Status: Hold For - Appointment   Requested for: 18Jan2017  Discussion/Summary   Kimberly Kimberly Riley currently has a 9 x 6 mm distal right ureteral stone. There is no indication for any urgent intervention. Her pain is relatively well managed at this time. There is certainly no evidence of a concurrent infection. This is a relatively large stone. She does, however, have a track record of passing stones this size. We talked about several options. We eventually have agreed to put her tentatively on the operative schedule for about 2 weeks from  today. If her situation worsens and she is doing poorly from a clinical prospective, she can call us sooner and/or present to the [facility] Emergency Room where she potentially can get intervention on a more urgent basis. Obviously if she passes the stone, we would be delighted to cancel her surgery. She has had stone analysis performed before but has never done metabolic studies which I have recommended. We will reiterate that recommendation on followup.      Signatures Electronically signed by : Barron Alvine, M.D.; Feb 16 2015  9:33PM EST

## 2015-02-28 DIAGNOSIS — L700 Acne vulgaris: Secondary | ICD-10-CM | POA: Diagnosis not present

## 2015-03-01 ENCOUNTER — Ambulatory Visit (HOSPITAL_BASED_OUTPATIENT_CLINIC_OR_DEPARTMENT_OTHER): Payer: 59 | Admitting: Anesthesiology

## 2015-03-01 ENCOUNTER — Encounter (HOSPITAL_BASED_OUTPATIENT_CLINIC_OR_DEPARTMENT_OTHER): Payer: Self-pay | Admitting: *Deleted

## 2015-03-01 ENCOUNTER — Ambulatory Visit (HOSPITAL_BASED_OUTPATIENT_CLINIC_OR_DEPARTMENT_OTHER)
Admission: RE | Admit: 2015-03-01 | Discharge: 2015-03-01 | Disposition: A | Discharge status: Home / Self Care | Payer: 59 | Source: Ambulatory Visit | Attending: Urology | Admitting: Urology

## 2015-03-01 ENCOUNTER — Encounter (HOSPITAL_BASED_OUTPATIENT_CLINIC_OR_DEPARTMENT_OTHER)
Admission: RE | Disposition: A | Discharge status: Home / Self Care | Payer: Self-pay | Source: Ambulatory Visit | Attending: Urology

## 2015-03-01 DIAGNOSIS — Z87442 Personal history of urinary calculi: Secondary | ICD-10-CM | POA: Insufficient documentation

## 2015-03-01 DIAGNOSIS — Z79891 Long term (current) use of opiate analgesic: Secondary | ICD-10-CM | POA: Diagnosis not present

## 2015-03-01 DIAGNOSIS — N201 Calculus of ureter: Secondary | ICD-10-CM | POA: Diagnosis not present

## 2015-03-01 DIAGNOSIS — K580 Irritable bowel syndrome with diarrhea: Secondary | ICD-10-CM | POA: Diagnosis not present

## 2015-03-01 DIAGNOSIS — N132 Hydronephrosis with renal and ureteral calculous obstruction: Secondary | ICD-10-CM | POA: Insufficient documentation

## 2015-03-01 DIAGNOSIS — N189 Chronic kidney disease, unspecified: Secondary | ICD-10-CM | POA: Diagnosis not present

## 2015-03-01 DIAGNOSIS — Z79899 Other long term (current) drug therapy: Secondary | ICD-10-CM | POA: Insufficient documentation

## 2015-03-01 DIAGNOSIS — K219 Gastro-esophageal reflux disease without esophagitis: Secondary | ICD-10-CM | POA: Diagnosis not present

## 2015-03-01 HISTORY — PX: CYSTOSCOPY WITH RETROGRADE PYELOGRAM, URETEROSCOPY AND STENT PLACEMENT: SHX5789

## 2015-03-01 HISTORY — PX: HOLMIUM LASER APPLICATION: SHX5852

## 2015-03-01 SURGERY — CYSTOURETEROSCOPY, WITH RETROGRADE PYELOGRAM AND STENT INSERTION
Anesthesia: General | Site: Ureter | Laterality: Right

## 2015-03-01 MED ORDER — SCOPOLAMINE 1 MG/3DAYS TD PT72
MEDICATED_PATCH | TRANSDERMAL | Status: DC | PRN
  Administered 2015-03-01: 1 via TRANSDERMAL

## 2015-03-01 MED ORDER — CIPROFLOXACIN IN D5W 400 MG/200ML IV SOLN
INTRAVENOUS | Status: AC
  Filled 2015-03-01: qty 200

## 2015-03-01 MED ORDER — PROPOFOL 10 MG/ML IV BOLUS
INTRAVENOUS | Status: AC
  Filled 2015-03-01: qty 20

## 2015-03-01 MED ORDER — GLYCOPYRROLATE 0.2 MG/ML IJ SOLN
INTRAMUSCULAR | Status: AC
  Filled 2015-03-01: qty 1

## 2015-03-01 MED ORDER — KETOROLAC TROMETHAMINE 30 MG/ML IJ SOLN
INTRAMUSCULAR | Status: DC | PRN
  Administered 2015-03-01: 30 mg via INTRAVENOUS

## 2015-03-01 MED ORDER — ONDANSETRON HCL 4 MG/2ML IJ SOLN
INTRAMUSCULAR | Status: AC
  Filled 2015-03-01: qty 2

## 2015-03-01 MED ORDER — FENTANYL CITRATE (PF) 100 MCG/2ML IJ SOLN
INTRAMUSCULAR | Status: DC | PRN
  Administered 2015-03-01 (×2): 25 ug via INTRAVENOUS
  Administered 2015-03-01: 50 ug via INTRAVENOUS

## 2015-03-01 MED ORDER — SODIUM CHLORIDE 0.9 % IR SOLN
Status: DC | PRN
  Administered 2015-03-01: 1000 mL
  Administered 2015-03-01: 3000 mL

## 2015-03-01 MED ORDER — LIDOCAINE HCL (CARDIAC) 20 MG/ML IV SOLN
INTRAVENOUS | Status: DC | PRN
  Administered 2015-03-01: 80 mg via INTRAVENOUS

## 2015-03-01 MED ORDER — CIPROFLOXACIN IN D5W 400 MG/200ML IV SOLN
400.0000 mg | INTRAVENOUS | Status: AC
  Administered 2015-03-01: 400 mg via INTRAVENOUS
  Filled 2015-03-01: qty 200

## 2015-03-01 MED ORDER — SCOPOLAMINE 1 MG/3DAYS TD PT72
MEDICATED_PATCH | TRANSDERMAL | Status: AC
  Filled 2015-03-01: qty 1

## 2015-03-01 MED ORDER — HYDROMORPHONE HCL 2 MG PO TABS: 2.0000 mg | ORAL_TABLET | ORAL | Status: DC | PRN

## 2015-03-01 MED ORDER — URIBEL 118 MG PO CAPS: 1.0000 | ORAL_CAPSULE | Freq: Three times a day (TID) | ORAL | Status: DC | PRN

## 2015-03-01 MED ORDER — MIDAZOLAM HCL 5 MG/5ML IJ SOLN
INTRAMUSCULAR | Status: DC | PRN
  Administered 2015-03-01: 2 mg via INTRAVENOUS

## 2015-03-01 MED ORDER — DEXAMETHASONE SODIUM PHOSPHATE 10 MG/ML IJ SOLN
INTRAMUSCULAR | Status: AC
  Filled 2015-03-01: qty 1

## 2015-03-01 MED ORDER — KETOROLAC TROMETHAMINE 30 MG/ML IJ SOLN
INTRAMUSCULAR | Status: AC
  Filled 2015-03-01: qty 1

## 2015-03-01 MED ORDER — LACTATED RINGERS IV SOLN
INTRAVENOUS | Status: DC
  Administered 2015-03-01: 11:00:00 via INTRAVENOUS
  Filled 2015-03-01: qty 1000

## 2015-03-01 MED ORDER — LIDOCAINE HCL (CARDIAC) 20 MG/ML IV SOLN
INTRAVENOUS | Status: AC
  Filled 2015-03-01: qty 5

## 2015-03-01 MED ORDER — PROMETHAZINE HCL 25 MG/ML IJ SOLN
6.2500 mg | INTRAMUSCULAR | Status: DC | PRN
  Filled 2015-03-01: qty 1

## 2015-03-01 MED ORDER — LACTATED RINGERS IV SOLN
INTRAVENOUS | Status: DC
  Filled 2015-03-01: qty 1000

## 2015-03-01 MED ORDER — PROPOFOL 10 MG/ML IV BOLUS
INTRAVENOUS | Status: DC | PRN
  Administered 2015-03-01: 200 mg via INTRAVENOUS

## 2015-03-01 MED ORDER — MIDAZOLAM HCL 2 MG/2ML IJ SOLN
INTRAMUSCULAR | Status: AC
  Filled 2015-03-01: qty 2

## 2015-03-01 MED ORDER — MEPERIDINE HCL 25 MG/ML IJ SOLN
6.2500 mg | INTRAMUSCULAR | Status: DC | PRN
  Filled 2015-03-01: qty 1

## 2015-03-01 MED ORDER — DEXAMETHASONE SODIUM PHOSPHATE 4 MG/ML IJ SOLN
INTRAMUSCULAR | Status: DC | PRN
  Administered 2015-03-01: 10 mg via INTRAVENOUS

## 2015-03-01 MED ORDER — FENTANYL CITRATE (PF) 100 MCG/2ML IJ SOLN
INTRAMUSCULAR | Status: AC
  Filled 2015-03-01: qty 2

## 2015-03-01 MED ORDER — FENTANYL CITRATE (PF) 100 MCG/2ML IJ SOLN
25.0000 ug | INTRAMUSCULAR | Status: DC | PRN
  Filled 2015-03-01: qty 1

## 2015-03-01 MED ORDER — ONDANSETRON HCL 4 MG/2ML IJ SOLN
INTRAMUSCULAR | Status: DC | PRN
  Administered 2015-03-01: 4 mg via INTRAVENOUS

## 2015-03-01 SURGICAL SUPPLY — 43 items
ADAPTER CATH URET PLST 4-6FR (CATHETERS) IMPLANT
ADPR CATH URET STRL DISP 4-6FR (CATHETERS)
APL SKNCLS STERI-STRIP NONHPOA (GAUZE/BANDAGES/DRESSINGS)
BAG DRAIN URO-CYSTO SKYTR STRL (DRAIN) ×3 IMPLANT
BAG DRN UROCATH (DRAIN) ×1
BASKET LASER NITINOL 1.9FR (BASKET) IMPLANT
BASKET STNLS GEMINI 4WIRE 3FR (BASKET) IMPLANT
BASKET ZERO TIP NITINOL 2.4FR (BASKET) ×3 IMPLANT
BENZOIN TINCTURE PRP APPL 2/3 (GAUZE/BANDAGES/DRESSINGS) IMPLANT
BSKT STON RTRVL 120 1.9FR (BASKET)
BSKT STON RTRVL GEM 120X11 3FR (BASKET)
BSKT STON RTRVL ZERO TP 2.4FR (BASKET) ×1
CANISTER SUCT LVC 12 LTR MEDI- (MISCELLANEOUS) IMPLANT
CATH INTERMIT  6FR 70CM (CATHETERS) IMPLANT
CATH URET 5FR 28IN CONE TIP (BALLOONS)
CATH URET 5FR 28IN OPEN ENDED (CATHETERS) ×2 IMPLANT
CATH URET 5FR 70CM CONE TIP (BALLOONS) IMPLANT
CLOTH BEACON ORANGE TIMEOUT ST (SAFETY) ×3 IMPLANT
DRSG TEGADERM 2-3/8X2-3/4 SM (GAUZE/BANDAGES/DRESSINGS) IMPLANT
FIBER LASER FLEXIVA 1000 (UROLOGICAL SUPPLIES) IMPLANT
FIBER LASER FLEXIVA 365 (UROLOGICAL SUPPLIES) ×2 IMPLANT
FIBER LASER FLEXIVA 550 (UROLOGICAL SUPPLIES) IMPLANT
FIBER LASER TRAC TIP (UROLOGICAL SUPPLIES) IMPLANT
GLOVE BIO SURGEON STRL SZ7.5 (GLOVE) ×5 IMPLANT
GLOVE SURG SS PI 7.5 STRL IVOR (GLOVE) ×2 IMPLANT
GOWN STRL REUS W/ TWL XL LVL3 (GOWN DISPOSABLE) ×1 IMPLANT
GOWN STRL REUS W/TWL XL LVL3 (GOWN DISPOSABLE) ×3
GUIDEWIRE 0.038 PTFE COATED (WIRE) IMPLANT
GUIDEWIRE ANG ZIPWIRE 038X150 (WIRE) IMPLANT
GUIDEWIRE STR DUAL SENSOR (WIRE) IMPLANT
IV NS IRRIG 3000ML ARTHROMATIC (IV SOLUTION) ×6 IMPLANT
KIT BALLIN UROMAX 15FX10 (LABEL) IMPLANT
KIT BALLN UROMAX 15FX4 (MISCELLANEOUS) IMPLANT
KIT BALLN UROMAX 26 75X4 (MISCELLANEOUS)
KIT ROOM TURNOVER WOR (KITS) ×3 IMPLANT
MANIFOLD NEPTUNE II (INSTRUMENTS) IMPLANT
NS IRRIG 500ML POUR BTL (IV SOLUTION) IMPLANT
PACK CYSTO (CUSTOM PROCEDURE TRAY) ×3 IMPLANT
SET HIGH PRES BAL DIL (LABEL)
SHEATH ACCESS URETERAL 38CM (SHEATH) IMPLANT
STENT URET 6FRX24 CONTOUR (STENTS) ×2 IMPLANT
TUBE CONNECTING 12'X1/4 (SUCTIONS)
TUBE CONNECTING 12X1/4 (SUCTIONS) IMPLANT

## 2015-03-01 NOTE — Op Note (Signed)
Preoperative diagnosis: right ureteral calculus  Postoperative diagnosis: right ureteral calculus  Procedure:  1. Cystoscopy 2. right ureteroscopy and stone removal 3. Ureteroscopic laser lithotripsy 4. right ureteral stent placement (50f) 24 cm with string 5. right retrograde pyelography with interpretation  Surgeon: Valetta Fuller, MD  Anesthesia: General  Complications: None  Intraoperative findings: right retrograde pyelography demonstrated a filling defect within the right ureter consistent with the patient's known calculus without other abnormalities.  EBL: Minimal  Specimens: 1. right ureteral calculus  Disposition of specimens: [facility] for stone analysis  Indication: Kimberly Riley is a 40 y.o.   patient with urolithiasis. After reviewing the management options for treatment, the patient elected to proceed with the above surgical procedure(s). We have discussed the potential benefits and risks of the procedure, side effects of the proposed treatment, the likelihood of the patient achieving the goals of the procedure, and any potential problems that might occur during the procedure or recuperation. Informed consent has been obtained.  Description of procedure:  The patient was taken to the operating room and general anesthesia was induced.  The patient was placed in the dorsal lithotomy position, prepped and draped in the usual sterile fashion, and preoperative antibiotics were administered. A preoperative time-out was performed.   Cystourethroscopy was performed . The bladder was then systematically examined in its entirety. There was no evidence for any bladder tumors, stones, or other mucosal pathology.    Attention then turned to the right ureteral orifice and a ureteral catheter was used to intubate the ureteral orifice.  Omnipaque contrast was injected through the ureteral catheter and a retrograde pyelogram was performed with findings as dictated  above.  A 0.38 sensor guidewire was then advanced up the right ureter into the renal pelvis under fluoroscopic guidance. The 6 Fr semirigid ureteroscope was then advanced into the ureter next to the guidewire and the calculus was identified.   The stone was then fragmented with the 365 micron holmium laser fiber on a setting of 0.6J and frequency of 6 Hz.   All stones were then removed from the ureter with a zero tip nitinol basket.  Reinspection of the ureter revealed no remaining visible stones or fragments.   The wire was then backloaded through the cystoscope and a ureteral stent was advance over the wire using Seldinger technique.  The stent was positioned appropriately under fluoroscopic and cystoscopic guidance.  The wire was then removed with an adequate stent curl noted in the renal pelvis as well as in the bladder.  The bladder was then emptied and the procedure ended.  The patient appeared to tolerate the procedure well and without complications.  The patient was able to be awakened and transferred to the recovery unit in satisfactory condition.

## 2015-03-01 NOTE — Anesthesia Procedure Notes (Signed)
Procedure Name: LMA Insertion Date/Time: 03/01/2015 12:02 PM Performed by: Renella Cunas D Pre-anesthesia Checklist: Patient identified, Emergency Drugs available, Suction available and Patient being monitored Patient Re-evaluated:Patient Re-evaluated prior to inductionOxygen Delivery Method: Circle System Utilized Preoxygenation: Pre-oxygenation with 100% oxygen Intubation Type: IV induction Ventilation: Mask ventilation without difficulty LMA: LMA inserted LMA Size: 4.0 Number of attempts: 1 Airway Equipment and Method: Bite block Placement Confirmation: positive ETCO2 Tube secured with: Tape Dental Injury: Teeth and Oropharynx as per pre-operative assessment

## 2015-03-01 NOTE — Transfer of Care (Signed)
Immediate Anesthesia Transfer of Care Note  Patient: Kimberly Riley  Procedure(s) Performed: Procedure(s) (LRB): CYSTOSCOPY WITH RETROGRADE PYELOGRAM, URETEROSCOPY AND POSSIBLE STENT PLACEMENT (Right) HOLMIUM LASER APPLICATION (Right)  Patient Location: PACU  Anesthesia Type: General  Level of Consciousness: awake, oriented, sedated and patient cooperative  Airway & Oxygen Therapy: Patient Spontanous Breathing and Patient connected to face mask oxygen  Post-op Assessment: Report given to PACU RN and Post -op Vital signs reviewed and stable  Post vital signs: Reviewed and stable  Complications: No apparent anesthesia complications

## 2015-03-01 NOTE — Interval H&P Note (Signed)
History and Physical Interval Note:  03/01/2015 11:36 AM  Kimberly Riley  has presented today for surgery, with the diagnosis of RIGHT URETERAL CALCULI  The various methods of treatment have been discussed with the patient and family. After consideration of risks, benefits and other options for treatment, the patient has consented to  Procedure(s): CYSTOSCOPY WITH RETROGRADE PYELOGRAM, URETEROSCOPY AND POSSIBLE STENT PLACEMENT (Right) HOLMIUM LASER APPLICATION (Right) as a surgical intervention .  The patient's history has been reviewed, patient examined, no change in status, stable for surgery.  I have reviewed the patient's chart and labs.  Questions were answered to the patient's satisfaction.     Waldon Sheerin S

## 2015-03-01 NOTE — Anesthesia Preprocedure Evaluation (Addendum)
Anesthesia Evaluation  Patient identified by MRN, date of birth, ID band Patient awake    Reviewed: Allergy & Precautions, NPO status , Patient's Chart, lab work & pertinent test results  History of Anesthesia Complications (+) PONV and history of anesthetic complications  Airway Mallampati: I  TM Distance: >3 FB Neck ROM: Full    Dental  (+) Teeth Intact   Pulmonary neg pulmonary ROS,    breath sounds clear to auscultation       Cardiovascular negative cardio ROS   Rhythm:Regular Rate:Normal     Neuro/Psych PSYCHIATRIC DISORDERS Depression negative neurological ROS     GI/Hepatic Neg liver ROS, GERD  Medicated,  Endo/Other  negative endocrine ROS  Renal/GU Renal InsufficiencyRenal disease  negative genitourinary   Musculoskeletal negative musculoskeletal ROS (+)   Abdominal   Peds negative pediatric ROS (+)  Hematology negative hematology ROS (+)   Anesthesia Other Findings - IBS   Reproductive/Obstetrics negative OB ROS                            Lab Results  Component Value Date   WBC 16.8* 02/11/2015   HGB 14.0 02/11/2015   HCT 41.1 02/11/2015   MCV 89.3 02/11/2015   PLT 333 02/11/2015   Lab Results  Component Value Date   CREATININE 0.79 02/11/2015   BUN 13 02/11/2015   NA 140 02/11/2015   K 3.9 02/11/2015   CL 110 02/11/2015   CO2 21* 02/11/2015   No results found for: INR, PROTIME   Anesthesia Physical Anesthesia Plan  ASA: III  Anesthesia Plan: General   Post-op Pain Management:    Induction: Intravenous  Airway Management Planned: LMA  Additional Equipment:   Intra-op Plan:   Post-operative Plan: Extubation in OR  Informed Consent: I have reviewed the patients History and Physical, chart, labs and discussed the procedure including the risks, benefits and alternatives for the proposed anesthesia with the patient or authorized representative who has  indicated his/her understanding and acceptance.   Dental advisory given  Plan Discussed with: CRNA  Anesthesia Plan Comments:         Anesthesia Quick Evaluation

## 2015-03-01 NOTE — Discharge Instructions (Addendum)
DISCHARGE INSTRUCTIONS FOR KIDNEY STONES OR URETERAL STENT  MEDICATIONS:   1. DO NOT RESUME YOUR ASPIRIN, or any other medicines like ibuprofen, motrin, excedrin, advil, aleve, vitamin E, fish oil as these can all cause bleeding x 7 days.  2. Resume all your other meds from home - except do not take any other pain meds that you may have at home.  ACTIVITY 1. No strenuous activity x 1week 2. No driving while on narcotic pain medications 3. Drink plenty of water 4. Continue to walk at home - you can still get blood clots when you are at home, so keep active, but don't over do it. 5. May return to work in 3 days.  BATHING 1. You can shower and we recommend daily showers  2. If you have a string coming from your urethra:  The stent string is attached to your ureteral stent.  Do not pull on this.   SIGNS/SYMPTOMS TO CALL: 1. Please call us if you have a fever greater than 101.5, uncontrolled  nausea/vomiting, uncontrolled pain, dizziness, unable to urinate, bloody urine, chest pain, shortness of breath, leg swelling, leg pain, redness around wound, drainage from wound, or any other concerns or questions.  You can reach [place] at [phone].  FOLLOW-UP 1. You will have to call for a follow up to be done in about 2 weeks 2. If you have a string attached to your stent, you may remove it on Sunday.  To do this, pull the string until the stent is completely removed.  You may feel an odd sensation in your back. 3.   Post Anesthesia Home Care Instructions  Activity: Get plenty of rest for the remainder of the day. A responsible adult should stay with you for 24 hours following the procedure.  For the next 24 hours, DO NOT: -Drive a car -Advertising copywriter -Drink alcoholic beverages -Take any medication unless instructed by your physician -Make any legal decisions or sign important papers.  Meals: Start with liquid foods such as gelatin or soup. Progress to regular foods as tolerated.  Avoid greasy, spicy, heavy foods. If nausea and/or vomiting occur, drink only clear liquids until the nausea and/or vomiting subsides. Call your physician if vomiting continues.  Special Instructions/Symptoms: Your throat may feel dry or sore from the anesthesia or the breathing tube placed in your throat during surgery. If this causes discomfort, gargle with warm salt water. The discomfort should disappear within 24 hours.  If you had a scopolamine patch placed behind your ear for the management of post- operative nausea and/or vomiting:  1. The medication in the patch is effective for 72 hours, after which it should be removed.  Wrap patch in a tissue and discard in the trash. Wash hands thoroughly with soap and water. 2. You may remove the patch earlier than 72 hours if you experience unpleasant side effects which may include dry mouth, dizziness or visual disturbances. 3. Avoid touching the patch. Wash your hands with soap and water after contact with the patch.

## 2015-03-02 ENCOUNTER — Encounter (HOSPITAL_BASED_OUTPATIENT_CLINIC_OR_DEPARTMENT_OTHER): Payer: Self-pay | Admitting: Urology

## 2015-03-02 NOTE — Anesthesia Postprocedure Evaluation (Signed)
Anesthesia Post Note  Patient: Kimberly Riley  Procedure(s) Performed: Procedure(s) (LRB): CYSTOSCOPY WITH RETROGRADE PYELOGRAM, URETEROSCOPY AND POSSIBLE STENT PLACEMENT (Right) HOLMIUM LASER APPLICATION (Right)  Patient location during evaluation: PACU Anesthesia Type: General Level of consciousness: awake and alert Pain management: pain level controlled Vital Signs Assessment: post-procedure vital signs reviewed and stable Respiratory status: spontaneous breathing, nonlabored ventilation, respiratory function stable and patient connected to nasal cannula oxygen Cardiovascular status: blood pressure returned to baseline and stable Postop Assessment: no signs of nausea or vomiting Anesthetic complications: no    Last Vitals:  Filed Vitals:   03/01/15 1315 03/01/15 1350  BP: 112/82 114/81  Pulse: 81 79  Temp:  36.6 C  Resp: 18 20    Last Pain:  Filed Vitals:   03/01/15 1350  PainSc: Asleep                 Shelton Silvas

## 2015-03-14 MED FILL — DICYCLOMINE 10 MG CAPSULE: 10 | 15 days supply | Qty: 90 | Fill #1

## 2015-03-14 MED FILL — URIBEL CAPSULE: 118 | 5 days supply | Qty: 15 | Fill #0

## 2015-03-14 MED FILL — HYDROmorphone HCL 2 MG TABS: 2 | 5 days supply | Qty: 30 | Fill #0

## 2015-03-15 ENCOUNTER — Encounter: Payer: Self-pay | Admitting: Internal Medicine

## 2015-03-16 MED FILL — SPIRONOLACTONE 50 MG TABLET: 50 | 30 days supply | Qty: 30 | Fill #0

## 2015-04-05 DIAGNOSIS — N200 Calculus of kidney: Secondary | ICD-10-CM | POA: Diagnosis not present

## 2015-04-07 DIAGNOSIS — N200 Calculus of kidney: Secondary | ICD-10-CM | POA: Diagnosis not present

## 2015-04-10 MED FILL — TRI-LO-ESTARYLLA TABLET: 0.18/0.215/ | 42 days supply | Qty: 56 | Fill #0

## 2015-04-12 DIAGNOSIS — J069 Acute upper respiratory infection, unspecified: Secondary | ICD-10-CM | POA: Diagnosis not present

## 2015-04-19 DIAGNOSIS — L700 Acne vulgaris: Secondary | ICD-10-CM | POA: Diagnosis not present

## 2015-04-19 DIAGNOSIS — N202 Calculus of kidney with calculus of ureter: Secondary | ICD-10-CM | POA: Diagnosis not present

## 2015-04-19 DIAGNOSIS — Z0000 Encounter for general adult medical examination without abnormal findings: Secondary | ICD-10-CM | POA: Diagnosis not present

## 2015-05-01 MED FILL — POTASSIUM CITRATE ER 15 MEQ: 15 MEQ | 30 days supply | Qty: 60 | Fill #0

## 2015-05-02 MED FILL — SPIRONOLACTONE 50 MG TABLET: 50 | 30 days supply | Qty: 30 | Fill #0

## 2015-05-19 DIAGNOSIS — Z113 Encounter for screening for infections with a predominantly sexual mode of transmission: Secondary | ICD-10-CM | POA: Diagnosis not present

## 2015-05-19 DIAGNOSIS — Z1389 Encounter for screening for other disorder: Secondary | ICD-10-CM | POA: Diagnosis not present

## 2015-05-19 DIAGNOSIS — Z01419 Encounter for gynecological examination (general) (routine) without abnormal findings: Secondary | ICD-10-CM | POA: Diagnosis not present

## 2015-05-19 DIAGNOSIS — R87610 Atypical squamous cells of undetermined significance on cytologic smear of cervix (ASC-US): Secondary | ICD-10-CM | POA: Diagnosis not present

## 2015-05-19 DIAGNOSIS — N921 Excessive and frequent menstruation with irregular cycle: Secondary | ICD-10-CM | POA: Diagnosis not present

## 2015-05-19 DIAGNOSIS — Z6836 Body mass index (BMI) 36.0-36.9, adult: Secondary | ICD-10-CM | POA: Diagnosis not present

## 2015-05-19 DIAGNOSIS — R87618 Other abnormal cytological findings on specimens from cervix uteri: Secondary | ICD-10-CM | POA: Diagnosis not present

## 2015-05-19 DIAGNOSIS — Z130 Encounter for screening for diseases of the blood and blood-forming organs and certain disorders involving the immune mechanism: Secondary | ICD-10-CM | POA: Diagnosis not present

## 2015-05-30 DIAGNOSIS — L700 Acne vulgaris: Secondary | ICD-10-CM | POA: Diagnosis not present

## 2015-06-06 MED FILL — POTASSIUM CITRATE ER 15 MEQ: 15 MEQ | 30 days supply | Qty: 60 | Fill #1

## 2015-06-20 DIAGNOSIS — R87610 Atypical squamous cells of undetermined significance on cytologic smear of cervix (ASC-US): Secondary | ICD-10-CM | POA: Diagnosis not present

## 2015-06-20 DIAGNOSIS — N870 Mild cervical dysplasia: Secondary | ICD-10-CM | POA: Diagnosis not present

## 2015-07-31 MED FILL — POTASSIUM CITRATE ER 15 MEQ: 15 MEQ | 30 days supply | Qty: 60 | Fill #2

## 2015-09-28 MED FILL — POTASSIUM CITRATE ER 15 MEQ: 15 MEQ | 30 days supply | Qty: 60 | Fill #3

## 2015-11-02 DIAGNOSIS — Z87442 Personal history of urinary calculi: Secondary | ICD-10-CM | POA: Diagnosis not present

## 2015-11-16 DIAGNOSIS — L700 Acne vulgaris: Secondary | ICD-10-CM | POA: Diagnosis not present

## 2015-11-16 MED FILL — DOXYCYCLINE HYCLATE 100 MG: 100 | 30 days supply | Qty: 60 | Fill #0

## 2015-11-16 MED FILL — CLINDAMYCIN PHOSP 1% LOTION: 1 | 30 days supply | Qty: 60 | Fill #0

## 2015-11-23 MED FILL — TRETINOIN 0.05% CREAM: 0.05 | 90 days supply | Qty: 45 | Fill #0

## 2015-11-24 MED FILL — POTASSIUM CITRATE ER 15 MEQ: 15 MEQ | 30 days supply | Qty: 60 | Fill #4

## 2015-12-05 MED FILL — FLUCONAZOLE 150 MG TABLET: 150 | 1 days supply | Qty: 1 | Fill #0

## 2015-12-25 MED FILL — DOXYCYCLINE HYCLATE 100 MG: 100 | 30 days supply | Qty: 60 | Fill #1

## 2016-01-29 MED FILL — POTASSIUM CITRATE ER 15 MEQ: 15 MEQ | 30 days supply | Qty: 60 | Fill #5

## 2016-01-29 MED FILL — DOXYCYCLINE HYCLATE 100 MG: 100 | 30 days supply | Qty: 60 | Fill #2

## 2016-02-13 DIAGNOSIS — L700 Acne vulgaris: Secondary | ICD-10-CM | POA: Diagnosis not present

## 2016-02-13 DIAGNOSIS — Z79899 Other long term (current) drug therapy: Secondary | ICD-10-CM | POA: Diagnosis not present

## 2016-02-21 MED FILL — TRI-PREVIFEM TABLET: 0.18/0.215/ | 28 days supply | Qty: 28 | Fill #0

## 2016-03-01 MED FILL — POTASSIUM CITRATE ER 15 MEQ: 15 MEQ | 30 days supply | Qty: 60 | Fill #0

## 2016-03-19 DIAGNOSIS — L700 Acne vulgaris: Secondary | ICD-10-CM | POA: Diagnosis not present

## 2016-03-19 DIAGNOSIS — Z79899 Other long term (current) drug therapy: Secondary | ICD-10-CM | POA: Diagnosis not present

## 2016-03-20 MED FILL — MYORISAN 40 MG CAPSULE: 40 | 30 days supply | Qty: 30 | Fill #0

## 2016-03-21 MED FILL — TRI-PREVIFEM TABLET: 0.18/0.215/ | 28 days supply | Qty: 28 | Fill #1

## 2016-04-08 DIAGNOSIS — B009 Herpesviral infection, unspecified: Secondary | ICD-10-CM | POA: Diagnosis not present

## 2016-04-08 DIAGNOSIS — K589 Irritable bowel syndrome without diarrhea: Secondary | ICD-10-CM | POA: Diagnosis not present

## 2016-04-08 MED FILL — DICYCLOMINE 10 MG CAPSULE: 10 | 15 days supply | Qty: 90 | Fill #0

## 2016-04-10 MED FILL — POTASSIUM CITRATE ER 15 MEQ: 15 MEQ | 30 days supply | Qty: 60 | Fill #1

## 2016-04-15 MED FILL — TRI-PREVIFEM TABLET: 0.18/0.215/ | 84 days supply | Qty: 84 | Fill #2

## 2016-04-18 DIAGNOSIS — L700 Acne vulgaris: Secondary | ICD-10-CM | POA: Diagnosis not present

## 2016-04-18 DIAGNOSIS — Z79899 Other long term (current) drug therapy: Secondary | ICD-10-CM | POA: Diagnosis not present

## 2016-04-22 MED FILL — MYORISAN 40 MG CAPSULE: 40 | 30 days supply | Qty: 60 | Fill #0

## 2016-05-14 MED FILL — POTASSIUM CITRATE ER 15 MEQ: 15 MEQ | 30 days supply | Qty: 60 | Fill #2

## 2016-05-21 DIAGNOSIS — B002 Herpesviral gingivostomatitis and pharyngotonsillitis: Secondary | ICD-10-CM | POA: Diagnosis not present

## 2016-05-21 DIAGNOSIS — Z79899 Other long term (current) drug therapy: Secondary | ICD-10-CM | POA: Diagnosis not present

## 2016-05-21 DIAGNOSIS — L700 Acne vulgaris: Secondary | ICD-10-CM | POA: Diagnosis not present

## 2016-05-23 MED FILL — MYORISAN 40 MG CAPSULE: 40 | 30 days supply | Qty: 60 | Fill #0

## 2016-06-04 DIAGNOSIS — Z01419 Encounter for gynecological examination (general) (routine) without abnormal findings: Secondary | ICD-10-CM | POA: Diagnosis not present

## 2016-06-04 DIAGNOSIS — L282 Other prurigo: Secondary | ICD-10-CM | POA: Diagnosis not present

## 2016-06-04 DIAGNOSIS — R3121 Asymptomatic microscopic hematuria: Secondary | ICD-10-CM | POA: Diagnosis not present

## 2016-06-04 DIAGNOSIS — Z1151 Encounter for screening for human papillomavirus (HPV): Secondary | ICD-10-CM | POA: Diagnosis not present

## 2016-06-04 DIAGNOSIS — Z6833 Body mass index (BMI) 33.0-33.9, adult: Secondary | ICD-10-CM | POA: Diagnosis not present

## 2016-06-04 DIAGNOSIS — Z130 Encounter for screening for diseases of the blood and blood-forming organs and certain disorders involving the immune mechanism: Secondary | ICD-10-CM | POA: Diagnosis not present

## 2016-06-04 DIAGNOSIS — R87611 Atypical squamous cells cannot exclude high grade squamous intraepithelial lesion on cytologic smear of cervix (ASC-H): Secondary | ICD-10-CM | POA: Diagnosis not present

## 2016-06-04 DIAGNOSIS — Z113 Encounter for screening for infections with a predominantly sexual mode of transmission: Secondary | ICD-10-CM | POA: Diagnosis not present

## 2016-06-04 DIAGNOSIS — Z124 Encounter for screening for malignant neoplasm of cervix: Secondary | ICD-10-CM | POA: Diagnosis not present

## 2016-06-04 DIAGNOSIS — Z1389 Encounter for screening for other disorder: Secondary | ICD-10-CM | POA: Diagnosis not present

## 2016-06-04 DIAGNOSIS — Z1231 Encounter for screening mammogram for malignant neoplasm of breast: Secondary | ICD-10-CM | POA: Diagnosis not present

## 2016-06-10 ENCOUNTER — Other Ambulatory Visit: Payer: Self-pay | Admitting: Obstetrics and Gynecology

## 2016-06-10 DIAGNOSIS — R928 Other abnormal and inconclusive findings on diagnostic imaging of breast: Secondary | ICD-10-CM

## 2016-06-13 ENCOUNTER — Other Ambulatory Visit: Payer: 59

## 2016-06-17 ENCOUNTER — Ambulatory Visit
Admission: RE | Admit: 2016-06-17 | Discharge: 2016-06-17 | Disposition: A | Discharge status: Home / Self Care | Payer: 59 | Source: Ambulatory Visit | Attending: Obstetrics and Gynecology | Admitting: Obstetrics and Gynecology

## 2016-06-17 ENCOUNTER — Other Ambulatory Visit: Payer: Self-pay | Admitting: Obstetrics and Gynecology

## 2016-06-17 DIAGNOSIS — N63 Unspecified lump in unspecified breast: Secondary | ICD-10-CM

## 2016-06-17 DIAGNOSIS — R928 Other abnormal and inconclusive findings on diagnostic imaging of breast: Secondary | ICD-10-CM

## 2016-06-17 DIAGNOSIS — N6489 Other specified disorders of breast: Secondary | ICD-10-CM | POA: Diagnosis not present

## 2016-06-20 DIAGNOSIS — L700 Acne vulgaris: Secondary | ICD-10-CM | POA: Diagnosis not present

## 2016-06-20 DIAGNOSIS — Z79899 Other long term (current) drug therapy: Secondary | ICD-10-CM | POA: Diagnosis not present

## 2016-06-20 DIAGNOSIS — K130 Diseases of lips: Secondary | ICD-10-CM | POA: Diagnosis not present

## 2016-06-21 MED FILL — POTASSIUM CITRATE ER 15 MEQ: 15 MEQ | 30 days supply | Qty: 60 | Fill #3

## 2016-06-25 MED FILL — MYORISAN 40 MG CAPSULE: 40 | 30 days supply | Qty: 60 | Fill #0

## 2016-07-23 DIAGNOSIS — L700 Acne vulgaris: Secondary | ICD-10-CM | POA: Diagnosis not present

## 2016-07-23 DIAGNOSIS — Z79899 Other long term (current) drug therapy: Secondary | ICD-10-CM | POA: Diagnosis not present

## 2016-07-25 MED FILL — MYORISAN 40 MG CAPSULE: 40 | 30 days supply | Qty: 60 | Fill #0

## 2016-07-25 MED FILL — POTASSIUM CITRATE ER 15 MEQ: 15 MEQ | 30 days supply | Qty: 60 | Fill #4

## 2016-07-25 MED FILL — TRI-PREVIFEM TABLET: 0.18/0.215/ | 84 days supply | Qty: 84 | Fill #3

## 2016-08-19 DIAGNOSIS — N870 Mild cervical dysplasia: Secondary | ICD-10-CM | POA: Diagnosis not present

## 2016-08-19 DIAGNOSIS — R87611 Atypical squamous cells cannot exclude high grade squamous intraepithelial lesion on cytologic smear of cervix (ASC-H): Secondary | ICD-10-CM | POA: Diagnosis not present

## 2016-08-21 DIAGNOSIS — Z79899 Other long term (current) drug therapy: Secondary | ICD-10-CM | POA: Diagnosis not present

## 2016-08-21 DIAGNOSIS — L700 Acne vulgaris: Secondary | ICD-10-CM | POA: Diagnosis not present

## 2016-08-23 MED FILL — MYORISAN 40 MG CAPSULE: 40 | 30 days supply | Qty: 60 | Fill #0

## 2016-09-11 MED FILL — POTASSIUM CITRATE ER 15 MEQ: 15 MEQ | 30 days supply | Qty: 60 | Fill #5

## 2016-09-24 DIAGNOSIS — L700 Acne vulgaris: Secondary | ICD-10-CM | POA: Diagnosis not present

## 2016-09-24 DIAGNOSIS — Z79899 Other long term (current) drug therapy: Secondary | ICD-10-CM | POA: Diagnosis not present

## 2016-10-22 MED FILL — POTASSIUM CITRATE ER 15 MEQ: 15 MEQ | 30 days supply | Qty: 60 | Fill #6

## 2016-10-23 DIAGNOSIS — Z79899 Other long term (current) drug therapy: Secondary | ICD-10-CM | POA: Diagnosis not present

## 2016-12-02 MED FILL — DICYCLOMINE 10 MG CAPSULE: 10 | 15 days supply | Qty: 90 | Fill #1

## 2016-12-05 MED FILL — POTASSIUM CITRATE ER 15 MEQ: 15 MEQ | 30 days supply | Qty: 60 | Fill #0

## 2016-12-24 ENCOUNTER — Other Ambulatory Visit: Payer: Self-pay | Admitting: Obstetrics and Gynecology

## 2016-12-24 ENCOUNTER — Ambulatory Visit
Admission: RE | Admit: 2016-12-24 | Discharge: 2016-12-24 | Disposition: A | Discharge status: Home / Self Care | Payer: 59 | Source: Ambulatory Visit | Attending: Obstetrics and Gynecology | Admitting: Obstetrics and Gynecology

## 2016-12-24 DIAGNOSIS — N6312 Unspecified lump in the right breast, upper inner quadrant: Secondary | ICD-10-CM | POA: Diagnosis not present

## 2016-12-24 DIAGNOSIS — N63 Unspecified lump in unspecified breast: Secondary | ICD-10-CM

## 2016-12-24 DIAGNOSIS — N6311 Unspecified lump in the right breast, upper outer quadrant: Secondary | ICD-10-CM | POA: Diagnosis not present

## 2017-01-08 DIAGNOSIS — R3129 Other microscopic hematuria: Secondary | ICD-10-CM | POA: Diagnosis not present

## 2017-01-08 DIAGNOSIS — N200 Calculus of kidney: Secondary | ICD-10-CM | POA: Diagnosis not present

## 2017-01-15 MED FILL — POTASSIUM CITRATE ER 15 MEQ: 15 MEQ | 30 days supply | Qty: 60 | Fill #1

## 2017-02-10 DIAGNOSIS — N200 Calculus of kidney: Secondary | ICD-10-CM | POA: Diagnosis not present

## 2017-02-18 MED FILL — POTASSIUM CITRATE ER 15 MEQ: 15 MEQ | 30 days supply | Qty: 60 | Fill #2

## 2017-03-04 ENCOUNTER — Other Ambulatory Visit: Payer: Self-pay | Admitting: Urology

## 2017-03-11 ENCOUNTER — Other Ambulatory Visit: Payer: Self-pay

## 2017-03-11 ENCOUNTER — Encounter (HOSPITAL_BASED_OUTPATIENT_CLINIC_OR_DEPARTMENT_OTHER): Payer: Self-pay | Admitting: *Deleted

## 2017-03-11 NOTE — Progress Notes (Signed)
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

## 2017-03-24 NOTE — H&P (Signed)
Urology Preoperative H&P   Chief Complaint: Right kidney stone  History of Present Illness: Kimberly Riley is a 42 y.o. female with a long history of kidney stones requiring surgical intervention (ureteroscopy). She was recently seen by Dr. Isabel CapriceGrapey and was found to have a 9 mm right renal stone on surveillance KUB. She denies interval UTIs, flank pain, fever/chills, nausea/vomiting or hematuria.    Past Medical History:  Diagnosis Date  . Cough    last few days with mild sore throat, pt instrcuted to call family md if chest congestion or fever  . Depression   . GERD (gastroesophageal reflux disease)   . History of kidney stones    x 4 passed on own 4 surgeries  . IBS (irritable bowel syndrome)   . Irregular menstrual cycle   . PONV (postoperative nausea and vomiting)    better with last surgery  . Right ureteral stone     Past Surgical History:  Procedure Laterality Date  . CHOLECYSTECTOMY  1998  . CYSTOSCOPY W/ RETROGRADES Bilateral 11/29/2013   Procedure: CYSTOSCOPY WITH RETROGRADE PYELOGRAM;  Surgeon: Valetta Fulleravid S Grapey, MD;  [facility] [place];  Service: Urology;  Laterality: Bilateral;  . CYSTOSCOPY WITH RETROGRADE PYELOGRAM, URETEROSCOPY AND STENT PLACEMENT Left 11/29/2013   Procedure: CYSTOSCOPY WITH URETEROSCOPY AND STENT PLACEMENT;  Surgeon: Valetta Fulleravid S Grapey, MD;  [facility] [place];  Service: Urology;  Laterality: Left;  . CYSTOSCOPY WITH RETROGRADE PYELOGRAM, URETEROSCOPY AND STENT PLACEMENT Right 04/21/2013   Procedure: CYSTOSCOPY WITH RIGHT RETROGRADE PYELOGRAM, RIGHT DIGITAL URETEROSCOPY AND RIGHT STENT PLACEMENT;  Surgeon: Valetta Fulleravid S Grapey, MD;  [facility] [place];  Service: Urology;  Laterality: Right;  . CYSTOSCOPY WITH RETROGRADE PYELOGRAM, URETEROSCOPY AND STENT PLACEMENT Right 03/01/2015   Procedure: CYSTOSCOPY WITH RETROGRADE PYELOGRAM, URETEROSCOPY AND POSSIBLE STENT PLACEMENT;  Surgeon: Barron Alvineavid Grapey, MD;  [facility]  [place];  Service: Urology;  Laterality: Right;  . CYTSO/  RIGHT URETEROSCOPIC LASER LITHOTRIPSY STONE EXTRACTION/  STENT PLACEMENT  05-20-2011  . ESSURE TUBAL LIGATION  SEPT 2007  . HOLMIUM LASER APPLICATION Right 04/21/2013   Procedure: HOLMIUM LASER APPLICATION;  Surgeon: Valetta Fulleravid S Grapey, MD;  [facility] [place];  Service: Urology;  Laterality: Right;  . HOLMIUM LASER APPLICATION Right 03/01/2015   Procedure: HOLMIUM LASER APPLICATION;  Surgeon: Barron Alvineavid Grapey, MD;  [facility] [place];  Service: Urology;  Laterality: Right;  . STRABISMUS SURGERY  AS CHILD  . URETEROSCOPIC LASER LITHO/ STONE EXTRACTION  1998    Allergies:  Allergies  Allergen Reactions  . Penicillins Shortness Of Breath, Swelling and Rash    History reviewed. No pertinent family history.  Social History:  reports that  has never smoked. she has never used smokeless tobacco. She reports that she drinks alcohol. She reports that she does not use drugs.  ROS: A complete review of systems was performed.  All systems are negative except for pertinent findings as noted.  Physical Exam:  Vital signs in last 24 hours:   Constitutional:  Alert and oriented, No acute distress Cardiovascular: Regular rate and rhythm, No JVD Respiratory: Normal respiratory effort, Lungs clear bilaterally GI: Abdomen is soft, nontender, nondistended, no abdominal masses GU: No CVA tenderness Lymphatic: No lymphadenopathy Neurologic: Grossly intact, no focal deficits Psychiatric: Normal mood and affect  Laboratory Data:  No results for input(s): WBC, HGB, HCT, PLT in the last 72 hours.  No results for input(s): NA, K, CL, GLUCOSE, BUN, CALCIUM, CREATININE in the  last 72 hours.  Invalid input(s): CO3   No results found for this or any previous visit (from the past 24 hour(s)). No results found for this or any previous visit (from the past 240 hour(s)).  Renal Function: No results for  input(s): CREATININE in the last 168 hours. CrCl cannot be calculated (Patient's most recent lab result is older than the maximum 21 days allowed.).  Radiologic Imaging: No results found.  I independently reviewed the above imaging studies.  Assessment and Plan Kimberly Riley is a 42 y.o. female with 9 mm right lower pole renal stone  -The risks, benefits and alternatives of cystoscopy with RIGHT ureteroscopy, laser lithotripsy and right JJ stent placement as a possible staged procedure. Risks including, bleeding, urinary tract infection, ureteral injury, prolonged ureteral stent placement, retained kidney stones, and the inherent risks associated with general anesthesia.  She voices understanding and wishes to proceed.    Rhoderick Moody, MD 03/24/2017, 3:34 PM  [facility] Pager: [phone]

## 2017-03-25 ENCOUNTER — Ambulatory Visit (HOSPITAL_BASED_OUTPATIENT_CLINIC_OR_DEPARTMENT_OTHER)
Admission: RE | Admit: 2017-03-25 | Discharge: 2017-03-25 | Disposition: A | Discharge status: Home / Self Care | Payer: 59 | Source: Ambulatory Visit | Attending: Urology | Admitting: Urology

## 2017-03-25 ENCOUNTER — Ambulatory Visit (HOSPITAL_COMMUNITY): Payer: 59

## 2017-03-25 ENCOUNTER — Other Ambulatory Visit: Payer: Self-pay

## 2017-03-25 ENCOUNTER — Encounter (HOSPITAL_BASED_OUTPATIENT_CLINIC_OR_DEPARTMENT_OTHER): Payer: Self-pay | Admitting: Anesthesiology

## 2017-03-25 ENCOUNTER — Encounter (HOSPITAL_BASED_OUTPATIENT_CLINIC_OR_DEPARTMENT_OTHER)
Admission: RE | Disposition: A | Discharge status: Home / Self Care | Payer: Self-pay | Source: Ambulatory Visit | Attending: Urology

## 2017-03-25 ENCOUNTER — Ambulatory Visit (HOSPITAL_BASED_OUTPATIENT_CLINIC_OR_DEPARTMENT_OTHER): Payer: 59 | Admitting: Anesthesiology

## 2017-03-25 DIAGNOSIS — N200 Calculus of kidney: Secondary | ICD-10-CM | POA: Diagnosis not present

## 2017-03-25 DIAGNOSIS — K219 Gastro-esophageal reflux disease without esophagitis: Secondary | ICD-10-CM | POA: Insufficient documentation

## 2017-03-25 DIAGNOSIS — Z79899 Other long term (current) drug therapy: Secondary | ICD-10-CM | POA: Diagnosis not present

## 2017-03-25 DIAGNOSIS — K589 Irritable bowel syndrome without diarrhea: Secondary | ICD-10-CM | POA: Insufficient documentation

## 2017-03-25 DIAGNOSIS — N201 Calculus of ureter: Secondary | ICD-10-CM | POA: Diagnosis not present

## 2017-03-25 DIAGNOSIS — Z87442 Personal history of urinary calculi: Secondary | ICD-10-CM | POA: Insufficient documentation

## 2017-03-25 DIAGNOSIS — Z880 Allergy status to penicillin: Secondary | ICD-10-CM | POA: Diagnosis not present

## 2017-03-25 DIAGNOSIS — Z09 Encounter for follow-up examination after completed treatment for conditions other than malignant neoplasm: Secondary | ICD-10-CM

## 2017-03-25 DIAGNOSIS — R05 Cough: Secondary | ICD-10-CM | POA: Diagnosis not present

## 2017-03-25 DIAGNOSIS — N202 Calculus of kidney with calculus of ureter: Secondary | ICD-10-CM | POA: Diagnosis not present

## 2017-03-25 HISTORY — DX: Cough, unspecified: R05.9

## 2017-03-25 HISTORY — PX: CYSTOSCOPY/URETEROSCOPY/HOLMIUM LASER/STENT PLACEMENT: SHX6546

## 2017-03-25 HISTORY — DX: Cough: R05

## 2017-03-25 LAB — POCT HEMOGLOBIN-HEMACUE: Hemoglobin: 14.6 g/dL (ref 12.0–15.0)

## 2017-03-25 SURGERY — CYSTOSCOPY/URETEROSCOPY/HOLMIUM LASER/STENT PLACEMENT
Anesthesia: General | Laterality: Right

## 2017-03-25 MED ORDER — ALBUTEROL SULFATE (2.5 MG/3ML) 0.083% IN NEBU
INHALATION_SOLUTION | RESPIRATORY_TRACT | Status: AC
  Filled 2017-03-25: qty 3

## 2017-03-25 MED ORDER — DEXAMETHASONE SODIUM PHOSPHATE 10 MG/ML IJ SOLN
INTRAMUSCULAR | Status: DC | PRN
  Administered 2017-03-25: 10 mg via INTRAVENOUS

## 2017-03-25 MED ORDER — PHENAZOPYRIDINE HCL 200 MG PO TABS: 200.0000 mg | ORAL_TABLET | Freq: Three times a day (TID) | ORAL | 0 refills | Status: AC | PRN

## 2017-03-25 MED ORDER — SUCCINYLCHOLINE CHLORIDE 20 MG/ML IJ SOLN
INTRAMUSCULAR | Status: DC | PRN
  Administered 2017-03-25: 100 mg via INTRAVENOUS
  Administered 2017-03-25: 40 mg via INTRAVENOUS

## 2017-03-25 MED ORDER — HYDROCODONE-ACETAMINOPHEN 5-325 MG PO TABS: 1.0000 | ORAL_TABLET | ORAL | 0 refills | Status: AC | PRN

## 2017-03-25 MED ORDER — MEPERIDINE HCL 25 MG/ML IJ SOLN
6.2500 mg | INTRAMUSCULAR | Status: DC | PRN
  Filled 2017-03-25: qty 1

## 2017-03-25 MED ORDER — SULFAMETHOXAZOLE-TRIMETHOPRIM 800-160 MG PO TABS: 1.0000 | ORAL_TABLET | Freq: Two times a day (BID) | ORAL | 0 refills | Status: AC

## 2017-03-25 MED ORDER — ACETAMINOPHEN 10 MG/ML IV SOLN
1000.0000 mg | Freq: Once | INTRAVENOUS | Status: AC
  Administered 2017-03-25: 1000 mg via INTRAVENOUS
  Filled 2017-03-25: qty 100

## 2017-03-25 MED ORDER — OXYCODONE HCL 5 MG/5ML PO SOLN
5.0000 mg | Freq: Once | ORAL | Status: DC | PRN
  Filled 2017-03-25: qty 5

## 2017-03-25 MED ORDER — FENTANYL CITRATE (PF) 100 MCG/2ML IJ SOLN
INTRAMUSCULAR | Status: AC
  Filled 2017-03-25: qty 2

## 2017-03-25 MED ORDER — SCOPOLAMINE 1 MG/3DAYS TD PT72
MEDICATED_PATCH | TRANSDERMAL | Status: DC | PRN
  Administered 2017-03-25: 1 via TRANSDERMAL

## 2017-03-25 MED ORDER — MIDAZOLAM HCL 2 MG/2ML IJ SOLN
INTRAMUSCULAR | Status: AC
  Filled 2017-03-25: qty 2

## 2017-03-25 MED ORDER — ONDANSETRON HCL 4 MG PO TABS: 4.0000 mg | ORAL_TABLET | Freq: Every day | ORAL | 1 refills | Status: AC | PRN

## 2017-03-25 MED ORDER — ONDANSETRON HCL 4 MG/2ML IJ SOLN
INTRAMUSCULAR | Status: AC
  Filled 2017-03-25: qty 2

## 2017-03-25 MED ORDER — FENTANYL CITRATE (PF) 100 MCG/2ML IJ SOLN
INTRAMUSCULAR | Status: DC | PRN
  Administered 2017-03-25: 50 ug via INTRAVENOUS

## 2017-03-25 MED ORDER — ACETAMINOPHEN 10 MG/ML IV SOLN
INTRAVENOUS | Status: AC
  Filled 2017-03-25: qty 100

## 2017-03-25 MED ORDER — LIDOCAINE 2% (20 MG/ML) 5 ML SYRINGE
INTRAMUSCULAR | Status: DC | PRN
  Administered 2017-03-25: 100 mg via INTRAVENOUS

## 2017-03-25 MED ORDER — HYDROMORPHONE HCL 1 MG/ML IJ SOLN
0.2500 mg | INTRAMUSCULAR | Status: DC | PRN
  Filled 2017-03-25: qty 0.5

## 2017-03-25 MED ORDER — MIDAZOLAM HCL 2 MG/2ML IJ SOLN
INTRAMUSCULAR | Status: DC | PRN
  Administered 2017-03-25: 2 mg via INTRAVENOUS

## 2017-03-25 MED ORDER — PROMETHAZINE HCL 25 MG/ML IJ SOLN
6.2500 mg | INTRAMUSCULAR | Status: AC | PRN
  Administered 2017-03-25 (×2): 6.25 mg via INTRAVENOUS
  Filled 2017-03-25: qty 1

## 2017-03-25 MED ORDER — PROPOFOL 10 MG/ML IV BOLUS
INTRAVENOUS | Status: DC | PRN
  Administered 2017-03-25: 200 mg via INTRAVENOUS
  Administered 2017-03-25 (×2): 100 mg via INTRAVENOUS

## 2017-03-25 MED ORDER — LIDOCAINE 2% (20 MG/ML) 5 ML SYRINGE
INTRAMUSCULAR | Status: AC
Start: 2017-03-25 — End: 2017-03-25
  Filled 2017-03-25: qty 5

## 2017-03-25 MED ORDER — CIPROFLOXACIN IN D5W 400 MG/200ML IV SOLN
INTRAVENOUS | Status: AC
  Filled 2017-03-25: qty 200

## 2017-03-25 MED ORDER — LACTATED RINGERS IV SOLN
INTRAVENOUS | Status: DC
  Administered 2017-03-25 (×2): via INTRAVENOUS
  Filled 2017-03-25: qty 1000

## 2017-03-25 MED ORDER — PROMETHAZINE HCL 25 MG/ML IJ SOLN
INTRAMUSCULAR | Status: AC
  Filled 2017-03-25: qty 1

## 2017-03-25 MED ORDER — ALBUTEROL SULFATE (2.5 MG/3ML) 0.083% IN NEBU
2.5000 mg | INHALATION_SOLUTION | Freq: Four times a day (QID) | RESPIRATORY_TRACT | Status: DC | PRN
  Administered 2017-03-25: 2.5 mg via RESPIRATORY_TRACT
  Filled 2017-03-25: qty 3

## 2017-03-25 MED ORDER — OXYCODONE HCL 5 MG PO TABS
5.0000 mg | ORAL_TABLET | Freq: Once | ORAL | Status: DC | PRN
  Filled 2017-03-25: qty 1

## 2017-03-25 MED ORDER — DEXAMETHASONE SODIUM PHOSPHATE 10 MG/ML IJ SOLN
INTRAMUSCULAR | Status: AC
  Filled 2017-03-25: qty 1

## 2017-03-25 MED ORDER — PROPOFOL 10 MG/ML IV BOLUS
INTRAVENOUS | Status: AC
Start: 2017-03-25 — End: 2017-03-25
  Filled 2017-03-25: qty 20

## 2017-03-25 MED ORDER — ONDANSETRON HCL 4 MG/2ML IJ SOLN
INTRAMUSCULAR | Status: DC | PRN
  Administered 2017-03-25: 4 mg via INTRAVENOUS

## 2017-03-25 MED ORDER — CIPROFLOXACIN IN D5W 400 MG/200ML IV SOLN
400.0000 mg | Freq: Once | INTRAVENOUS | Status: AC
  Administered 2017-03-25: 400 mg via INTRAVENOUS
  Filled 2017-03-25: qty 200

## 2017-03-25 MED ORDER — SCOPOLAMINE 1 MG/3DAYS TD PT72
MEDICATED_PATCH | TRANSDERMAL | Status: AC
Start: 2017-03-25 — End: 2017-03-25
  Filled 2017-03-25: qty 1

## 2017-03-25 MED ORDER — KETOROLAC TROMETHAMINE 10 MG PO TABS: 10.0000 mg | ORAL_TABLET | Freq: Four times a day (QID) | ORAL | 0 refills | Status: AC | PRN

## 2017-03-25 MED FILL — PHENAZOPYRIDINE 200 MG TAB: 200 | 10 days supply | Qty: 30 | Fill #0

## 2017-03-25 MED FILL — KETOROLAC 10 MG TABLET: 10 | 5 days supply | Qty: 20 | Fill #0

## 2017-03-25 SURGICAL SUPPLY — 30 items
APL SKNCLS STERI-STRIP NONHPOA (GAUZE/BANDAGES/DRESSINGS)
BAG DRAIN URO-CYSTO SKYTR STRL (DRAIN) ×3 IMPLANT
BAG DRN UROCATH (DRAIN) ×1
BASKET STONE 1.7 NGAGE (UROLOGICAL SUPPLIES) IMPLANT
BASKET ZERO TIP NITINOL 2.4FR (BASKET) ×3 IMPLANT
BENZOIN TINCTURE PRP APPL 2/3 (GAUZE/BANDAGES/DRESSINGS) IMPLANT
BSKT STON RTRVL ZERO TP 2.4FR (BASKET) ×1
CATH URET 5FR 28IN OPEN ENDED (CATHETERS) ×2 IMPLANT
CATH URET DUAL LUMEN 6-10FR 50 (CATHETERS) ×2 IMPLANT
CLOSURE WOUND 1/2 X4 (GAUZE/BANDAGES/DRESSINGS)
CLOTH BEACON ORANGE TIMEOUT ST (SAFETY) ×3 IMPLANT
FIBER LASER FLEXIVA 365 (UROLOGICAL SUPPLIES) IMPLANT
FIBER LASER TRAC TIP (UROLOGICAL SUPPLIES) ×2 IMPLANT
GLOVE BIO SURGEON STRL SZ7.5 (GLOVE) ×3 IMPLANT
GOWN STRL REUS W/TWL XL LVL3 (GOWN DISPOSABLE) ×2 IMPLANT
GUIDEWIRE ANG ZIPWIRE 038X150 (WIRE) ×3 IMPLANT
GUIDEWIRE STR DUAL SENSOR (WIRE) ×2 IMPLANT
INFUSOR MANOMETER BAG 3000ML (MISCELLANEOUS) ×3 IMPLANT
IV NS 1000ML (IV SOLUTION) ×3
IV NS 1000ML BAXH (IV SOLUTION) IMPLANT
IV NS IRRIG 3000ML ARTHROMATIC (IV SOLUTION) ×3 IMPLANT
KIT TURNOVER CYSTO (KITS) ×3 IMPLANT
MANIFOLD NEPTUNE II (INSTRUMENTS) ×3 IMPLANT
NS IRRIG 500ML POUR BTL (IV SOLUTION) ×6 IMPLANT
PACK CYSTO (CUSTOM PROCEDURE TRAY) ×3 IMPLANT
STENT URET 6FRX26 CONTOUR (STENTS) ×2 IMPLANT
STRIP CLOSURE SKIN 1/2X4 (GAUZE/BANDAGES/DRESSINGS) IMPLANT
SYRINGE 10CC LL (SYRINGE) ×3 IMPLANT
TUBE CONNECTING 12'X1/4 (SUCTIONS) ×1
TUBE CONNECTING 12X1/4 (SUCTIONS) ×1 IMPLANT

## 2017-03-25 NOTE — Anesthesia Procedure Notes (Signed)
Procedure Name: LMA Insertion Date/Time: 03/25/2017 9:35 AM Performed by: Tyrone NineSauve, Haelie Clapp F, CRNA Pre-anesthesia Checklist: Patient identified, Timeout performed, Emergency Drugs available, Suction available and Patient being monitored Patient Re-evaluated:Patient Re-evaluated prior to induction Oxygen Delivery Method: Circle system utilized Preoxygenation: Pre-oxygenation with 100% oxygen Induction Type: IV induction Ventilation: Mask ventilation without difficulty LMA: LMA inserted LMA Size: 4.0 Number of attempts: 1 Placement Confirmation: breath sounds checked- equal and bilateral,  CO2 detector and positive ETCO2 Tube secured with: Tape Dental Injury: Teeth and Oropharynx as per pre-operative assessment

## 2017-03-25 NOTE — Discharge Instructions (Signed)
Post Anesthesia Home Care Instructions  Activity: Get plenty of rest for the remainder of the day. A responsible individual must stay with you for 24 hours following the procedure.  For the next 24 hours, DO NOT: -Drive a car -Operate machinery -Drink alcoholic beverages -Take any medication unless instructed by your physician -Make any legal decisions or sign important papers.  Meals: Start with liquid foods such as gelatin or soup. Progress to regular foods as tolerated. Avoid greasy, spicy, heavy foods. If nausea and/or vomiting occur, drink only clear liquids until the nausea and/or vomiting subsides. Call your physician if vomiting continues.  Special Instructions/Symptoms: Your throat may feel dry or sore from the anesthesia or the breathing tube placed in your throat during surgery. If this causes discomfort, gargle with warm salt water. The discomfort should disappear within 24 hours.  If you had a scopolamine patch placed behind your ear for the management of post- operative nausea and/or vomiting:  1. The medication in the patch is effective for 72 hours, after which it should be removed.  Wrap patch in a tissue and discard in the trash. Wash hands thoroughly with soap and water. 2. You may remove the patch earlier than 72 hours if you experience unpleasant side effects which may include dry mouth, dizziness or visual disturbances. 3. Avoid touching the patch. Wash your hands with soap and water after contact with the patch.      [facility] [phone] Post Ureteroscopy With or Without Stent Instructions  Definitions:  Ureter: The duct that transports urine from the kidney to the bladder. Stent:   A plastic hollow tube that is placed into the ureter, from the kidney to the bladder to prevent the ureter from swelling shut.  GENERAL INSTRUCTIONS:  Despite the fact that no skin incisions were used, the area around the ureter and bladder is raw and  irritated. The stent is a foreign body which will further irritate the bladder wall. This irritation is manifested by increased frequency of urination, both day and night, and by an increase in the urge to urinate. In some, the urge to urinate is present almost always. Sometimes the urge is strong enough that you may not be able to stop yourself from urinating. The only real cure is to remove the stent and then give time for the bladder wall to heal which can't be done until the danger of the ureter swelling shut has passed, which varies.  You may see some blood in your urine while the stent is in place and a few days afterwards. Do not be alarmed, even if the urine was clear for a while. Get off your feet and drink lots of fluids until clearing occurs. If you start to pass clots or don't improve, call us.  DIET: You may return to your normal diet immediately. Because of the raw surface of your bladder, alcohol, spicy foods, acid type foods and drinks with caffeine may cause irritation or frequency and should be used in moderation. To keep your urine flowing freely and to avoid constipation, drink plenty of fluids during the day ( 8-10 glasses ). Tip: Avoid cranberry juice because it is very acidic.  ACTIVITY: Your physical activity doesn't need to be restricted. However, if you are very active, you may see some blood in your urine. We suggest that you reduce your activity under these circumstances until the bleeding has stopped.  BOWELS: It is important to keep your bowels regular during the postoperative period. Straining   with bowel movements can cause bleeding. A bowel movement every other day is reasonable. Use a mild laxative if needed, such as Milk of Magnesia 2-3 tablespoons, or 2 Dulcolax tablets. Call if you continue to have problems. If you have been taking narcotics for pain, before, during or after your surgery, you may be constipated. Take a laxative if necessary.   MEDICATION: You  should resume your pre-surgery medications unless told not to. In addition you will often be given an antibiotic to prevent infection. These should be taken as prescribed until the bottles are finished unless you are having an unusual reaction to one of the drugs.  PROBLEMS YOU SHOULD REPORT TO US: Fevers over 100.5 Fahrenheit. Heavy bleeding, or clots ( See above notes about blood in urine ). Inability to urinate. Drug reactions ( hives, rash, nausea, vomiting, diarrhea ). Severe burning or pain with urination that is not improving.  FOLLOW-UP: You will need a follow-up appointment to monitor your progress. Call for this appointment at the number listed above. Usually the first appointment will be about three to fourteen days after your surgery.      

## 2017-03-25 NOTE — Interval H&P Note (Signed)
History and Physical Interval Note:  03/25/2017 9:22 AM  Kimberly Riley  has presented today for surgery, with the diagnosis of RIGHT RENAL STONE  The various methods of treatment have been discussed with the patient and family. After consideration of risks, benefits and other options for treatment, the patient has consented to  Procedure(s) with comments: CYSTOSCOPY/RETROGRADE/URETEROSCOPY/HOLMIUM LASER/STENT PLACEMENT (Right) - ONLY NEEDS 45 MIN FOR PROCEDURE as a surgical intervention .  The patient's history has been reviewed, patient examined, no change in status, stable for surgery.  I have reviewed the patient's chart and labs.  Questions were answered to the patient's satisfaction.     [name]

## 2017-03-25 NOTE — Anesthesia Preprocedure Evaluation (Addendum)
Anesthesia Evaluation  Patient identified by MRN, date of birth, ID band Patient awake    Reviewed: Allergy & Precautions, NPO status , Patient's Chart, lab work & pertinent test results  History of Anesthesia Complications (+) PONV and history of anesthetic complications  Airway Mallampati: I  TM Distance: >3 FB Neck ROM: Full    Dental  (+) Teeth Intact, Dental Advisory Given   Pulmonary neg pulmonary ROS,    breath sounds clear to auscultation       Cardiovascular negative cardio ROS   Rhythm:Regular Rate:Normal     Neuro/Psych PSYCHIATRIC DISORDERS Depression negative neurological ROS     GI/Hepatic Neg liver ROS, GERD  Medicated and Controlled,  Endo/Other  negative endocrine ROS  Renal/GU Renal InsufficiencyRenal disease  negative genitourinary   Musculoskeletal negative musculoskeletal ROS (+)   Abdominal   Peds negative pediatric ROS (+)  Hematology negative hematology ROS (+)   Anesthesia Other Findings - IBS   Reproductive/Obstetrics negative OB ROS                            Lab Results  Component Value Date   WBC 16.8 (H) 02/11/2015   HGB 14.6 03/25/2017   HCT 41.1 02/11/2015   MCV 89.3 02/11/2015   PLT 333 02/11/2015   Lab Results  Component Value Date   CREATININE 0.79 02/11/2015   BUN 13 02/11/2015   NA 140 02/11/2015   K 3.9 02/11/2015   CL 110 02/11/2015   CO2 21 (L) 02/11/2015   No results found for: INR, PROTIME   Anesthesia Physical  Anesthesia Plan  ASA: II  Anesthesia Plan: General   Post-op Pain Management:    Induction: Intravenous  PONV Risk Score and Plan: 4 or greater and Ondansetron, Dexamethasone, Midazolam and Scopolamine patch - Pre-op  Airway Management Planned: LMA  Additional Equipment:   Intra-op Plan:   Post-operative Plan: Extubation in OR  Informed Consent: I have reviewed the patients History and Physical, chart,  labs and discussed the procedure including the risks, benefits and alternatives for the proposed anesthesia with the patient or authorized representative who has indicated his/her understanding and acceptance.   Dental advisory given  Plan Discussed with: CRNA  Anesthesia Plan Comments:         Anesthesia Quick Evaluation

## 2017-03-25 NOTE — Transfer of Care (Signed)
Immediate Anesthesia Transfer of Care Note  Patient: Kimberly Riley  Procedure(s) Performed: CYSTOSCOPY Right /RETROGRADE/RIGHT URETEROSCOPY/HOLMIUM LASER/STENT PLACEMENT (Right )  Patient Location: PACU  Anesthesia Type:General  Level of Consciousness: awake, alert , oriented and patient cooperative  Airway & Oxygen Therapy: Patient Spontanous Breathing and Patient connected to face mask oxygen  Post-op Assessment: Report given to RN and Post -op Vital signs reviewed and stable  Post vital signs: Reviewed and stable  Last Vitals:  Vitals:   03/25/17 0844 03/25/17 1023  BP: 126/89   Pulse: (!) 101 (!) 108  Resp: 16 17  Temp: 36.7 C 36.7 C  SpO2: 99% 90%    Last Pain:  Vitals:   03/25/17 0844  TempSrc: Oral         Complications: No apparent anesthesia complications

## 2017-03-25 NOTE — Anesthesia Procedure Notes (Signed)
Procedure Name: Intubation Date/Time: 03/25/2017 9:58 AM Performed by: Wanita Chamberlain, CRNA Pre-anesthesia Checklist: Timeout performed, Patient identified, Emergency Drugs available, Suction available and Patient being monitored Patient Re-evaluated:Patient Re-evaluated prior to induction Oxygen Delivery Method: Circle system utilized Preoxygenation: Pre-oxygenation with 100% oxygen Induction Type: IV induction Ventilation: Mask ventilation without difficulty Laryngoscope Size: Mac and 3 Grade View: Grade II Tube type: Oral Tube size: 7.0 mm Number of attempts: 1 Airway Equipment and Method: Stylet Placement Confirmation: breath sounds checked- equal and bilateral,  CO2 detector,  positive ETCO2 and ETT inserted through vocal cords under direct vision Secured at: 21 cm Tube secured with: Tape Dental Injury: Teeth and Oropharynx as per pre-operative assessment

## 2017-03-25 NOTE — Op Note (Signed)
Operative Note  Preoperative diagnosis:  1.  9 mm right renal stone  Postoperative diagnosis: 1.  Obstructing and impacted 9 mm right UPJ stone  Procedure(s): 1.  Cystoscopy 2.  Right retrograde pyelogram with intraoperative interpretation of fluoroscopic imaging 3.  Right ureteroscopy 4.  Right holmium laser lithotripsy 5.  Right JJ stent placement  Surgeon: Rhoderick Moodyhristopher Ambur Province, MD  Assistants: None  Anesthesia: General LMA converted to endotracheal following possible aspiration  Complications: Possible aspiration and reactive airway requiring removal of LMA and placement of an endotracheal tube  EBL: Less than 5 mL  Specimens: 1.  None  Drains/Catheters: 1.  Right 6 French JJ stent without tether  Intraoperative findings:   1.  Right retrograde pyelogram revealed a filling defect within the proximal aspects of the right ureter consistent with an obstructing stone.  There was moderate dilation of the right renal pelvis and its associated calyces secondary to the obstructing stone.  No other filling defects were identified within the right collecting system. 2.  Obstructing and impacted 9 mm right UPJ stone  Indication:  Kimberly Riley is a 42 y.o. female with a long history of nephrolithiasis.  She recently had a surveillance KUB that revealed a 9 mm right renal stone.  She has been consented for the above procedures, voices understanding and wishes to proceed.  Description of procedure:  After informed consent was obtained, the patient was brought to the operating room and general LMA anesthesia was administered. The patient was then placed in the dorsolithotomy position and prepped and draped in usual sterile fashion. A timeout was performed. A 21 French rigid cystoscope was then inserted into the urethral meatus and advanced into the bladder under direct vision. A complete bladder survey revealed no intravesical pathology.  A 5 French open-ended catheter was then inserted  into the right ureteral orifice and a retrograde pyelogram was obtained with the findings listed above.  A Glidewire was then advanced through the lumen of the ureteral catheter and up to the right renal pelvis, under fluoroscopic guidance.  A flexible ureteroscope was then advanced alongside the safety wire until her obstructing right UPJ stone was identified.  A 200 m holmium laser was then used to dust the obstructing stone.  There was circumferential edema and inflammation where the obstructing stone was lodged.  The flexible ureteroscope was then advanced into the right renal pelvis and a full inspection of all calyces revealed no additional stones.  The flexible ureteroscope was then removed under direct vision.  A 6 [place] JJ stent was then placed over the wire and into position within the right collecting system, confirming placement via fluoroscopy.  Her bladder was then drained.  She tolerated the procedure well with the exception of her airway issues.  She was transferred to the postanesthesia unit in stable condition.  Plan: Office visit in 2 weeks for cystoscopy and stent removal

## 2017-03-25 NOTE — Anesthesia Postprocedure Evaluation (Signed)
Anesthesia Post Note  Patient: Kimberly Riley  Procedure(s) Performed: CYSTOSCOPY Right /RETROGRADE/RIGHT URETEROSCOPY/HOLMIUM LASER/STENT PLACEMENT (Right )     Patient location during evaluation: PACU Anesthesia Type: General Level of consciousness: awake and alert Pain management: pain level controlled Vital Signs Assessment: post-procedure vital signs reviewed and stable Respiratory status: spontaneous breathing, nonlabored ventilation and respiratory function stable Cardiovascular status: blood pressure returned to baseline and stable Postop Assessment: no apparent nausea or vomiting Anesthetic complications: no Comments: Pt had a small aspiration event in the OR.  In PACU she initially required supplemental O2 and was coughing a lot.  This steadily improved and O2 was successfully weaned off. Sat 96% on room air.  CXR appeared normal.  Plan to discharge home when ready.    Last Vitals:  Vitals:   03/25/17 1130 03/25/17 1145  BP: 117/83 99/68  Pulse: (!) 108 (!) 106  Resp: 12 18  Temp:    SpO2: 96% 96%    Last Pain:  Vitals:   03/25/17 0844  TempSrc: Oral                 Velinda Wrobel S

## 2017-03-26 ENCOUNTER — Encounter (HOSPITAL_BASED_OUTPATIENT_CLINIC_OR_DEPARTMENT_OTHER): Payer: Self-pay | Admitting: Urology

## 2017-04-08 DIAGNOSIS — N202 Calculus of kidney with calculus of ureter: Secondary | ICD-10-CM | POA: Diagnosis not present

## 2017-04-14 MED FILL — POTASSIUM CITRATE ER 15 MEQ: 15 MEQ | 30 days supply | Qty: 60 | Fill #3

## 2017-05-07 ENCOUNTER — Encounter: Payer: 59 | Attending: Family Medicine | Admitting: Skilled Nursing Facility1

## 2017-05-07 ENCOUNTER — Encounter: Payer: Self-pay | Admitting: Skilled Nursing Facility1

## 2017-05-07 DIAGNOSIS — Z713 Dietary counseling and surveillance: Secondary | ICD-10-CM | POA: Diagnosis not present

## 2017-05-07 DIAGNOSIS — Z6834 Body mass index (BMI) 34.0-34.9, adult: Secondary | ICD-10-CM | POA: Diagnosis not present

## 2017-05-07 DIAGNOSIS — E639 Nutritional deficiency, unspecified: Secondary | ICD-10-CM

## 2017-05-07 NOTE — Progress Notes (Signed)
  Assessment:  Primary concerns today: self referral.  Pt states she is here for the 3 visits for lower insurance premiers.   Pt states she would like to lose weight. Pt states as an adult she does not have a usual weight. Pt states she has gained more weight since leaving the [employer]. Pt states she sleeps too much. Pt states she sleeps because she overthinks. Pt states she sleeps about 10-12 hours a night. Pt states Now for the last few days sleeping 3-4 hours. 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 Pt states she has struggled her whole life with weight. Pt states she has IBS. Pt states she has had a Divorce. Pt states she has struggled with depression since 5th grade. Pt states she has been constantly seeking approval from certain individuals not mentioned. Pt states she uses food as an escape. Pt states she does not eat lunch due to sleeping. Pt states she will eat supper at 4 with her son.   Pt was very tearful throughout the appointment.   MEDICATIONS: See List   DIETARY INTAKE:  24-hr recall:  B ( AM): skipped---fast food Snk ( AM):  L ( PM):  Snk ( PM):  D ( PM):  Snk ( PM):  Beverages:   Usual physical activity:   Estimated energy needs: calories g carbohydrates g protein g fat  Progress Towards Goal(s):  In progress.    Intervention:  Nutrition counseling for behavior change. Goals: Goal: To be happy with myself  Take a half day to tick mark every time you say something mean to yourself Take those negatives and turn them into a positive: example: I am fat turned to a positive: I am curvy and beautiful or My eyes are pretty  Explore the idea of mind over matter  Teaching Method Utilized:  Visual Auditory Hands on  Handouts given during visit include: -needs -emotions   Barriers to learning/adherence to lifestyle change:   Demonstrated degree of understanding via:  Teach Back   Monitoring/Evaluation:  Dietary intake, exercise, and body weight  prn.

## 2017-05-07 NOTE — Patient Instructions (Addendum)
Goal: To be happy with myself   Take a half day to tick mark every time you say something mean to yourself  Take those negatives and turn them into a positive: example: I am fat turned to a positive: I am curvy and beautiful or My eyes are pretty   Explore the idea of mind over matter

## 2017-05-17 ENCOUNTER — Ambulatory Visit (INDEPENDENT_AMBULATORY_CARE_PROVIDER_SITE_OTHER): Payer: Self-pay | Admitting: Family Medicine

## 2017-05-17 VITALS — BP 100/75 | HR 89 | Temp 98.1°F | Resp 16 | Wt 207.6 lb

## 2017-05-17 DIAGNOSIS — J309 Allergic rhinitis, unspecified: Secondary | ICD-10-CM

## 2017-05-17 DIAGNOSIS — J029 Acute pharyngitis, unspecified: Secondary | ICD-10-CM

## 2017-05-17 LAB — POCT RAPID STREP A (OFFICE): Rapid Strep A Screen: NEGATIVE

## 2017-05-17 MED ORDER — LEVOCETIRIZINE DIHYDROCHLORIDE 5 MG PO TABS: 5.0000 mg | ORAL_TABLET | Freq: Every evening | ORAL | 0 refills | Status: AC

## 2017-05-17 MED ORDER — FLUTICASONE PROPIONATE 50 MCG/ACT NA SUSP: 2.0000 | Freq: Every day | NASAL | 0 refills | Status: AC

## 2017-05-17 NOTE — Progress Notes (Signed)
Kimberly Riley is a 42 y.o. female who presents with 3 days of sore throat symptoms. She believed it was her allergies until her son started exhibiting similar symptoms.  Review of Systems  Constitutional: Negative for chills, fever and malaise/fatigue.  HENT: Positive for sore throat. Negative for congestion and sinus pain.   Eyes: Negative.   Respiratory: Negative.   Cardiovascular: Negative.   Gastrointestinal: Negative.   Genitourinary: Negative.   Musculoskeletal: Negative.   Skin: Negative.   Neurological: Negative.   Endo/Heme/Allergies: Negative.   Psychiatric/Behavioral: Negative.    O:  Physical Exam  Constitutional: Vital signs are normal. She appears well-developed and well-nourished. She is active.  HENT:  Head: Normocephalic.  Right Ear: Hearing, external ear and ear canal normal. A middle ear effusion is present.  Left Ear: Hearing, external ear and ear canal normal. A middle ear effusion is present.  Nose: Mucosal edema and rhinorrhea present.  Mouth/Throat: Uvula is midline, oropharynx is clear and moist and mucous membranes are normal. No oropharyngeal exudate, posterior oropharyngeal edema, posterior oropharyngeal erythema or tonsillar abscesses. Tonsils are 3+ on the right. Tonsils are 3+ on the left. No tonsillar exudate.  Chronic scarring to TM reported hx of tubes bilaterally  Eyes: Pupils are equal, round, and reactive to light.  Neck: Normal range of motion. Neck supple.  Cardiovascular: Normal rate.  Pulmonary/Chest: Effort normal and breath sounds normal.  Abdominal: Soft. Bowel sounds are normal.  Musculoskeletal: Normal range of motion.  Lymphadenopathy:       Head (right side): Tonsillar adenopathy present.       Head (left side): Tonsillar adenopathy present.    She has cervical adenopathy.  Neurological: She is alert.  Skin: Skin is warm and dry.  Psychiatric: She has a normal mood and affect.  Vitals reviewed.   A: 1. Allergic rhinitis,  unspecified seasonality, unspecified trigger   2. Sore throat    P:  Medication use and indications,and diagnosis etiology reviewed and discussed with patient who verbalized understanding and agrees with POC.  1. Allergic rhinitis, unspecified seasonality, unspecified trigger - fluticasone (FLONASE) 50 MCG/ACT nasal spray; Place 2 sprays into both nostrils daily. - levocetirizine (XYZAL) 5 MG tablet; Take 1 tablet (5 mg total) by mouth every evening.  2. Sore throat - POCT rapid strep A Results for orders placed or performed in visit on 05/17/17 (from the past 24 hour(s))  POCT rapid strep A     Status: Normal   Collection Time: 05/17/17 10:57 AM  Result Value Ref Range   Rapid Strep A Screen Negative Negative

## 2017-05-17 NOTE — Progress Notes (Signed)
POC

## 2017-05-17 NOTE — Patient Instructions (Signed)

## 2017-05-26 MED FILL — POTASSIUM CITRATE ER 15 MEQ: 15 MEQ | 30 days supply | Qty: 60 | Fill #4

## 2017-06-11 ENCOUNTER — Encounter: Payer: Self-pay | Admitting: Skilled Nursing Facility1

## 2017-06-11 ENCOUNTER — Encounter: Payer: 59 | Attending: Family Medicine | Admitting: Skilled Nursing Facility1

## 2017-06-11 DIAGNOSIS — Z713 Dietary counseling and surveillance: Secondary | ICD-10-CM | POA: Diagnosis not present

## 2017-06-11 DIAGNOSIS — E639 Nutritional deficiency, unspecified: Secondary | ICD-10-CM

## 2017-06-11 DIAGNOSIS — Z6834 Body mass index (BMI) 34.0-34.9, adult: Secondary | ICD-10-CM | POA: Insufficient documentation

## 2017-06-11 NOTE — Progress Notes (Signed)
  Assessment:  Primary concerns today: self referral.  Pt states she is here for the 3 visits for lower insurance premiers.   Pt states she has been Feeling better and not sleeping as much. Pt states she has Moved her mirror so she avoids seeing herself and thinking negatively about herself. Pt states she has IBS with stomach pains often.  Pt is struggling with the idea of the need to eat and sees food as the enemy that has made her fat and unhappy.   MEDICATIONS: See List   DIETARY INTAKE:  24-hr recall:  B ( AM): skipped---fast food or bread or bagel Snk ( AM):  L ( PM): skipped-drinking lemon water Snk ( PM):  D ( PM): chicken rice potatoes or vegetable  Snk ( PM): dessert, candy in bed  Beverages: lemon water  Usual physical activity:   Estimated energy needs: calories g carbohydrates g protein g fat  Progress Towards Goal(s):  In progress.    Intervention:  Nutrition counseling for behavior change. Goals: -Push dinner back to 6pm -Have breakfast around 10-10:30pm -Have a snack at 1pm -Try saying Food is Fine when you are about to eat -It does not matter what you eat or how much you eat just eat Teaching Method Utilized:  Visual Auditory Hands on  Barriers to learning/adherence to lifestyle change: food anxiety/fear  Demonstrated degree of understanding via:  Teach Back   Monitoring/Evaluation:  Dietary intake, exercise, and body weight prn.

## 2017-06-11 NOTE — Patient Instructions (Signed)
-  Push dinner back to 6pm  -Have breakfast around 10-10:30pm  -Have a snack at 1pm  -Try saying Food is Fine when you are about to eat

## 2017-06-17 DIAGNOSIS — Z113 Encounter for screening for infections with a predominantly sexual mode of transmission: Secondary | ICD-10-CM | POA: Diagnosis not present

## 2017-06-17 DIAGNOSIS — Z130 Encounter for screening for diseases of the blood and blood-forming organs and certain disorders involving the immune mechanism: Secondary | ICD-10-CM | POA: Diagnosis not present

## 2017-06-17 DIAGNOSIS — Z6835 Body mass index (BMI) 35.0-35.9, adult: Secondary | ICD-10-CM | POA: Diagnosis not present

## 2017-06-17 DIAGNOSIS — N921 Excessive and frequent menstruation with irregular cycle: Secondary | ICD-10-CM | POA: Diagnosis not present

## 2017-06-17 DIAGNOSIS — Z01419 Encounter for gynecological examination (general) (routine) without abnormal findings: Secondary | ICD-10-CM | POA: Diagnosis not present

## 2017-06-17 DIAGNOSIS — Z1389 Encounter for screening for other disorder: Secondary | ICD-10-CM | POA: Diagnosis not present

## 2017-06-17 DIAGNOSIS — Z87410 Personal history of cervical dysplasia: Secondary | ICD-10-CM | POA: Diagnosis not present

## 2017-06-18 DIAGNOSIS — Z1151 Encounter for screening for human papillomavirus (HPV): Secondary | ICD-10-CM | POA: Diagnosis not present

## 2017-06-18 DIAGNOSIS — R87611 Atypical squamous cells cannot exclude high grade squamous intraepithelial lesion on cytologic smear of cervix (ASC-H): Secondary | ICD-10-CM | POA: Diagnosis not present

## 2017-06-18 DIAGNOSIS — Z87410 Personal history of cervical dysplasia: Secondary | ICD-10-CM | POA: Diagnosis not present

## 2017-06-24 ENCOUNTER — Other Ambulatory Visit: Payer: 59

## 2017-06-26 ENCOUNTER — Ambulatory Visit: Payer: 59

## 2017-06-26 ENCOUNTER — Ambulatory Visit
Admission: RE | Admit: 2017-06-26 | Discharge: 2017-06-26 | Disposition: A | Discharge status: Home / Self Care | Payer: 59 | Source: Ambulatory Visit | Attending: Obstetrics and Gynecology | Admitting: Obstetrics and Gynecology

## 2017-06-26 DIAGNOSIS — N63 Unspecified lump in unspecified breast: Secondary | ICD-10-CM

## 2017-06-26 DIAGNOSIS — R922 Inconclusive mammogram: Secondary | ICD-10-CM | POA: Diagnosis not present

## 2017-07-14 MED FILL — POTASSIUM CITRATE ER 15 MEQ: 15 MEQ | 30 days supply | Qty: 60 | Fill #5

## 2017-07-16 ENCOUNTER — Encounter: Payer: 59 | Attending: Family Medicine | Admitting: Skilled Nursing Facility1

## 2017-07-16 ENCOUNTER — Encounter: Payer: Self-pay | Admitting: Skilled Nursing Facility1

## 2017-07-16 DIAGNOSIS — R87611 Atypical squamous cells cannot exclude high grade squamous intraepithelial lesion on cytologic smear of cervix (ASC-H): Secondary | ICD-10-CM | POA: Diagnosis not present

## 2017-07-16 DIAGNOSIS — Z6834 Body mass index (BMI) 34.0-34.9, adult: Secondary | ICD-10-CM | POA: Insufficient documentation

## 2017-07-16 DIAGNOSIS — E639 Nutritional deficiency, unspecified: Secondary | ICD-10-CM

## 2017-07-16 DIAGNOSIS — Z713 Dietary counseling and surveillance: Secondary | ICD-10-CM | POA: Diagnosis not present

## 2017-07-16 DIAGNOSIS — N871 Moderate cervical dysplasia: Secondary | ICD-10-CM | POA: Diagnosis not present

## 2017-07-16 NOTE — Progress Notes (Signed)
  Assessment:  Primary concerns today: self referral.  Pt has completed her 3 visits per insurance recommendations.   Pt arrived wanting to weigh and disappointed she did not lose weight. Pt states she does not feel bloated and uncomfortable. Pt states she was "in her feelings" and in the middle of eating doritos recognized she was eating due to her feelings. Pt staets she is No longer in bed all day. Pt states she feels maybe her (42 year old) Son being home for the summer has helped her be emotionally stable. Pt states she has Brought her bike into the house from the basement. Pt states she has been eating throughout the day. Pt states she wants to continue on with her appointments despite meeting her requirements and will check with her insurance to ensure the visits are covered.   MEDICATIONS: See List   DIETARY INTAKE:  24-hr recall:  B ( AM): leftovers or cereal a couple hours after waking  Snk ( AM):  L ( PM): leftovers  Snk ( PM):  D ( PM): chicken rice potatoes or vegetable  Snk ( PM): dessert, candy in bed  Beverages: lemon water  Usual physical activity: ADL's  Estimated energy needs: calories g carbohydrates g protein g fat  Progress Towards Goal(s):  In progress.    Intervention:  Nutrition counseling for behavior change. Goals: -Aim for 2 days a week on bike 60 minutes and free weights: 15 2 times and crunches: 5 -Continue to recognize when your in your head -Continue to eat throughout the day (does not matter what you eat, just eat)  Teaching Method Utilized:  Visual Auditory Hands on  Barriers to learning/adherence to lifestyle change: food anxiety/fear  Demonstrated degree of understanding via:  Teach Back   Monitoring/Evaluation:  Dietary intake, exercise, and body weight prn.

## 2017-07-16 NOTE — Patient Instructions (Signed)
-  Aim for 2 days a week on bike 60 minutes and free weights: 15 2 times and crunches: 5  -Continue to recognize when your in your head  -Continue to eat throughout the day

## 2017-07-17 DIAGNOSIS — N202 Calculus of kidney with calculus of ureter: Secondary | ICD-10-CM | POA: Diagnosis not present

## 2017-07-17 DIAGNOSIS — N1330 Unspecified hydronephrosis: Secondary | ICD-10-CM | POA: Diagnosis not present

## 2017-08-04 DIAGNOSIS — N870 Mild cervical dysplasia: Secondary | ICD-10-CM | POA: Diagnosis not present

## 2017-08-04 DIAGNOSIS — N871 Moderate cervical dysplasia: Secondary | ICD-10-CM | POA: Diagnosis not present

## 2017-08-12 ENCOUNTER — Ambulatory Visit: Payer: 59 | Admitting: Skilled Nursing Facility1

## 2017-08-12 MED FILL — DICYCLOMINE 10 MG CAPSULE: 10 | 15 days supply | Qty: 90 | Fill #0

## 2017-08-12 MED FILL — POTASSIUM CITRATE ER 15 MEQ: 15 MEQ | 30 days supply | Qty: 60 | Fill #6

## 2017-09-22 MED FILL — POTASSIUM CITRATE ER 15 MEQ: 15 MEQ | 30 days supply | Qty: 60 | Fill #0

## 2017-10-28 MED FILL — POTASSIUM CITRATE ER 15 MEQ: 15 MEQ | 30 days supply | Qty: 60 | Fill #1

## 2017-11-12 DIAGNOSIS — K589 Irritable bowel syndrome without diarrhea: Secondary | ICD-10-CM | POA: Diagnosis not present

## 2017-11-12 MED FILL — DICYCLOMINE 10 MG CAPSULE: 10 | 15 days supply | Qty: 90 | Fill #0

## 2017-12-15 MED FILL — POTASSIUM CITRATE ER 15 MEQ: 15 MEQ | 30 days supply | Qty: 60 | Fill #2

## 2018-01-14 MED FILL — POTASSIUM CITRATE ER 15 MEQ: 15 MEQ | 30 days supply | Qty: 60 | Fill #3

## 2018-02-25 MED FILL — POTASSIUM CITRATE ER 15 MEQ: 15 MEQ | 30 days supply | Qty: 60 | Fill #4

## 2018-04-01 MED FILL — POTASSIUM CITRATE ER 15 MEQ: 15 MEQ | 30 days supply | Qty: 60 | Fill #5 | Status: TO

## 2018-05-01 MED FILL — POTASSIUM CITRATE ER 15 MEQ: 15 MEQ | 30 days supply | Qty: 60 | Fill #0

## 2018-06-08 ENCOUNTER — Other Ambulatory Visit: Payer: Self-pay | Admitting: Obstetrics and Gynecology

## 2018-06-08 DIAGNOSIS — N6310 Unspecified lump in the right breast, unspecified quadrant: Secondary | ICD-10-CM

## 2018-06-19 MED FILL — POTASSIUM CITRATE ER 15 MEQ: 15 MEQ | 30 days supply | Qty: 60 | Fill #0

## 2018-06-23 MED FILL — DICYCLOMINE 10 MG CAPSULE: 10 | 15 days supply | Qty: 90 | Fill #0

## 2018-06-29 ENCOUNTER — Ambulatory Visit
Admission: RE | Admit: 2018-06-29 | Discharge: 2018-06-29 | Disposition: A | Discharge status: Home / Self Care | Payer: 59 | Source: Ambulatory Visit | Attending: Obstetrics and Gynecology | Admitting: Obstetrics and Gynecology

## 2018-06-29 ENCOUNTER — Other Ambulatory Visit: Payer: Self-pay

## 2018-06-29 ENCOUNTER — Ambulatory Visit
Admission: RE | Admit: 2018-06-29 | Discharge: 2018-06-29 | Disposition: A | Discharge status: Home / Self Care | Payer: No Typology Code available for payment source | Source: Ambulatory Visit | Attending: Obstetrics and Gynecology | Admitting: Obstetrics and Gynecology

## 2018-06-29 DIAGNOSIS — N6310 Unspecified lump in the right breast, unspecified quadrant: Secondary | ICD-10-CM

## 2018-07-24 MED FILL — POTASSIUM CITRATE ER 15 MEQ: 15 MEQ | 30 days supply | Qty: 60 | Fill #1

## 2018-08-06 ENCOUNTER — Other Ambulatory Visit: Payer: Self-pay

## 2018-08-06 ENCOUNTER — Encounter: Payer: Self-pay | Admitting: Registered"

## 2018-08-06 ENCOUNTER — Encounter: Payer: No Typology Code available for payment source | Attending: Family Medicine | Admitting: Registered"

## 2018-08-06 DIAGNOSIS — Z713 Dietary counseling and surveillance: Secondary | ICD-10-CM | POA: Insufficient documentation

## 2018-08-06 NOTE — Patient Instructions (Addendum)
Consider getting nutritional supplements you can drink at your desk. May help reduce hunger when you get off work. (Employee already has some at home) Consider having some snack you can eat in your car after work. 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

## 2018-08-06 NOTE — Progress Notes (Signed)
[facility] Employee Visit #1 of 3  Medical Nutrition Therapy:  Appt start time: 0800 end time:  0830.  Assessment:  Primary concerns today: Here for her insurance requirements.   Employee states that her eating habits are not that good, mostly due to working 3rd shift. 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   Sometimes drinks soda at work for a little pick-me up, does not drink all of the 8 oz can. Sometimes drinks soda at home. Employee states she usually drinks just water especially due to her history with kidney stones.  Eating patterns:  Fri & Sat gets up 8:15 pm to get ready for work, doesn't eat because rushing to get ready for work and out the door (~25-30 min commute)  Employee states since [employer]' hours in ED have been cut. During first couple hours of shift there is another employee there, but then she is alone the rest of her shift and doesn't want leave the desk for a break because she feels by the time she makes it to the break room clear down the hall, someone will come in who needs to be checked in and she will have to come right back. Before COVID employee reports she used to take a break and eat leftovers from home.  After work she is very hungry, will cook food for the day so her family will have food to eat, will eat her main meal with her son, then go to sleep.  Sleep: 12 yrs has had irregular sleep cycle. ~10-12 hours sleep on work days Thurs-Sat. On off days may only get 2 hrs sleep. Wants to be awake with kids on days off instead of sleeping.  Other problems include: IBS, kidney stones, irregular menstrual cycle   MEDICATIONS: reviewed   DIETARY INTAKE:  Usual physical activity: ADLs. Used to walk around neighborhood with son.  Progress Towards Goal(s):  New goals.   Intervention:  Brainstorming how to work around barriers to get in more regular meals/nutrition.  Plan: Consider getting nutritional  supplements you can drink at your desk. May help reduce hunger when you get off work. (Employee already has some at home) Consider having some snack you can eat in your car after work. 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  Handouts given during visit include:  none  Barriers to learning/adherence to lifestyle change: 3rd shift work and alternating sleep schedule on days off  Demonstrated degree of understanding via:  Teach Back   Monitoring/Evaluation:  Dietary intake, exercise, and body weight in a few week(s).

## 2018-08-13 ENCOUNTER — Ambulatory Visit: Payer: No Typology Code available for payment source | Admitting: Registered"

## 2018-08-20 ENCOUNTER — Ambulatory Visit: Payer: No Typology Code available for payment source | Admitting: Registered"

## 2018-08-25 MED FILL — POTASSIUM CITRATE ER 15 MEQ: 15 MEQ | 30 days supply | Qty: 60 | Fill #2

## 2018-09-01 ENCOUNTER — Other Ambulatory Visit: Payer: Self-pay

## 2018-09-01 ENCOUNTER — Encounter: Payer: No Typology Code available for payment source | Attending: Family Medicine | Admitting: Dietician

## 2018-09-01 ENCOUNTER — Ambulatory Visit: Payer: No Typology Code available for payment source | Admitting: Registered"

## 2018-09-01 DIAGNOSIS — Z713 Dietary counseling and surveillance: Secondary | ICD-10-CM | POA: Insufficient documentation

## 2018-09-01 NOTE — Patient Instructions (Signed)
Aim to incorporate breakfast. Use the Breakfast Ideas and MyPlate handouts for guidance.  Consider trying Oikos Triple Zero yogurts for a good source of protein in the morning or as a snack!

## 2018-09-01 NOTE — Progress Notes (Signed)
[facility] Employee Visit #2 of 3  Medical Nutrition Therapy:  Appt start time: 8:00 end time:  8:30  Employee ID [id number]  Assessment:  Primary concerns today: Here for her insurance requirements.   Employee states that her eating habits are not that good, mostly due to working 3rd shift. 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   Sometimes drinks soda at work for a little pick-me up, does not drink all of the 8 oz can. Sometimes drinks soda at home. Employee states she usually drinks just water especially due to her history with kidney stones.  Eating patterns:  Fri & Sat gets up 8:15 pm to get ready for work, doesn't eat because rushing to get ready for work and out the door (~25-30 min commute)  Employee states since [employer]' hours in ED have been cut. During first couple hours of shift there is another employee there, but then she is alone the rest of her shift and doesn't want leave the desk for a break because she feels by the time she makes it to the break room clear down the hall, someone will come in who needs to be checked in and she will have to come right back. Before COVID employee reports she used to take a break and eat leftovers from home.  After work she is very hungry, will cook food for the day so her family will have food to eat, will eat her main meal with her son, then go to sleep.  Sleep: 12 yrs has had irregular sleep cycle. ~10-12 hours sleep on work days Thurs-Sat. On off days may only get 2 hrs sleep. Wants to be awake with kids on days off instead of sleeping.  Other problems include: IBS, kidney stones, irregular menstrual cycle   Employee states that, since her first wellness visit, she worked on eating something right after work so she was not starving when she got home from her shift. States she focused on snacking less (particularly cutting out "junk foods" she was snacking on) and that this made her feel  much better, and she even lost a few pounds in 2-3 weeks. States she still has trouble knowing what is "good" to eat, because of all the information you hear about various diets. Asked questions about the keto diet, types of yogurt and milk, breakfast ideas, if protein is filling, if carbohydrates are bad for you, etc.    MEDICATIONS: reviewed   DIETARY INTAKE:  Usual physical activity: ADLs. Used to walk around neighborhood with son.  Progress Towards Goal(s): Improving on original goals + incorporated a new goal   Intervention:  Continuing to brainstorm how to work around barriers to get in more regular meals/nutrition, starting with incorporating breakfast. Answered questions regarding food and nutrition.   Plan: Aim to incorporate breakfast. Use the Breakfast Ideas and MyPlate handouts for guidance.   Handouts given during visit include:  Breakfast Ideas   MyPlate  Barriers to learning/adherence to lifestyle change: 3rd shift work and alternating sleep schedule on days off  Demonstrated degree of understanding via:  Teach Back   Monitoring/Evaluation:  Dietary intake, exercise, and body weight at next employee wellness visit.

## 2018-09-08 ENCOUNTER — Other Ambulatory Visit: Payer: Self-pay

## 2018-09-08 ENCOUNTER — Encounter: Payer: No Typology Code available for payment source | Admitting: Registered"

## 2018-09-08 ENCOUNTER — Ambulatory Visit: Payer: No Typology Code available for payment source | Admitting: Registered"

## 2018-09-08 DIAGNOSIS — Z713 Dietary counseling and surveillance: Secondary | ICD-10-CM

## 2018-09-08 NOTE — Progress Notes (Signed)
[facility] Employee Visit #3 of 3  Medical Nutrition Therapy:  Appt start time: 9:30 end time:  10:00  Employee ID [phone]  Assessment:  Primary concerns today: Here for her insurance requirements.   Since last appointment has purchased recommended breakfast foods. Employee states she is also looking forward to starting new schedule will probably start Oct. 9a-9p on Sat, Sun and 2-10p Mon plans to work on getting into a better sleep routine. Employee states it may be hard at first because she has worked 3rd shift for so long.  Employee states her IBS is something she grew up with and like any other issue she had her family had the attitude of "just deal with it" and has not sought treatment for it other than medication for painful spasm.  Employee states she has noticed pasta and dairy do seem to have direct effect on her IBS symptoms.  RD suggested she see a specialist for it and after her symptoms are fully investigated and if it is determined to be IBS and not some other issue, we could start a trial of a low FODMAP diet if she wants to.  MEDICATIONS: reviewed   DIETARY INTAKE:  Usual physical activity: ADLs.   24 hr recall ("yesterday not good") Br: banana Lunch: sandwich bacon, [place], tomato: white Dinner:3 pieces of pizza, pepsi Snack: feel asleep after dinner when woke up had som gobstoppers with her son Beverages: water  Progress Towards Goal(s): Improving on original goals + incorporated a new goal   Intervention:  Discussed meal options that may be lower in FODMAPs. Discussed how previous meal can cause IBS symptoms.  Plan: Great job on pursuing another schedule to get better sleep habits Look into seeing a doctor specifically for your IBS Chobani Greek yogurt Less Sugar might be a better choice than Okios because it doesn't have artificial sweetener which could trigger IBS symptoms. Look for lactose free dairy products Another idea for breakfast is cottage cheese &  fruit Cook the oatmeal, add nuts walnuts, berries, cinnamon Sourdough bread (without yeast) might be a better  Handouts given during visit include:  none  Barriers to learning/adherence to lifestyle change: 3rd shift work and alternating sleep schedule on days off  Demonstrated degree of understanding via:  Teach Back   Monitoring/Evaluation:  Dietary intake, exercise, and body weight prn.

## 2018-09-08 NOTE — Patient Instructions (Addendum)
Great job on pursuing another schedule to get better sleep habits Look into seeing a doctor specifically for your IBS Chobani Greek yogurt Less Sugar might be a better choice than Okios because it doesn't have artificial sweetener which could trigger IBS symptoms. Look for lactose free dairy products Another idea for breakfast is cottage cheese & fruit Cook the oatmeal, add nuts walnuts, berries, cinnamon Sourdough bread (without yeast) might be a better choice for IBS

## 2018-10-02 MED FILL — valACYclovir HCL 1 GM TABS: 1 | 15 days supply | Qty: 30 | Fill #0

## 2018-10-15 MED FILL — CLINDAMYCIN HCL 150 MG CAPS: 150 | 6 days supply | Qty: 21 | Fill #0

## 2018-10-26 MED FILL — POTASSIUM CITRATE ER 15 MEQ: 15 MEQ | 30 days supply | Qty: 60 | Fill #3

## 2018-11-06 MED FILL — DICYCLOMINE 10 MG CAPSULE: 10 | 15 days supply | Qty: 90 | Fill #1

## 2018-11-30 MED FILL — POTASSIUM CITRATE ER 15 MEQ: 15 MEQ | 30 days supply | Qty: 60 | Fill #4

## 2018-12-15 MED FILL — PHENTERMINE 37.5 MG TABLET: 37.5 | 30 days supply | Qty: 30 | Fill #0

## 2018-12-31 MED FILL — POTASSIUM CITRATE ER 15 MEQ: 15 MEQ | 30 days supply | Qty: 60 | Fill #5

## 2019-02-15 MED FILL — CLINDAMYCIN HCL 150 MG CAPS: 150 | 6 days supply | Qty: 21 | Fill #1

## 2019-02-15 MED FILL — POTASSIUM CITRATE ER 15 MEQ: 15 MEQ | 30 days supply | Qty: 60 | Fill #6

## 2019-02-16 MED FILL — PHENTERMINE 37.5 MG TABLET: 37.5 | 30 days supply | Qty: 30 | Fill #1

## 2019-03-19 MED FILL — POTASSIUM CITRATE ER 15 MEQ: 15 MEQ | 30 days supply | Qty: 60 | Fill #7

## 2019-05-04 ENCOUNTER — Other Ambulatory Visit (HOSPITAL_BASED_OUTPATIENT_CLINIC_OR_DEPARTMENT_OTHER): Payer: Self-pay | Admitting: Physician Assistant

## 2019-05-05 MED FILL — DICYCLOMINE 10 MG CAPSULE: 10 | 15 days supply | Qty: 90 | Fill #0

## 2019-05-26 MED FILL — POTASSIUM CITRATE ER 15 MEQ: 15 MEQ | 30 days supply | Qty: 60 | Fill #8

## 2019-06-18 ENCOUNTER — Telehealth: Payer: No Typology Code available for payment source | Admitting: Nurse Practitioner

## 2019-06-18 DIAGNOSIS — R112 Nausea with vomiting, unspecified: Secondary | ICD-10-CM

## 2019-06-18 MED ORDER — ONDANSETRON HCL 4 MG PO TABS: 4.0000 mg | ORAL_TABLET | Freq: Three times a day (TID) | ORAL | 0 refills | Status: AC | PRN

## 2019-06-18 NOTE — Progress Notes (Signed)
contacted patient by phone and he wanted to know if I could end in rx for toradol. Told patient that cannot do that in an e visit. Also told her that since she is in [place], they would probably not accept prescription from [facility] anyway. Suggested that she gi to the nearest ER or urgent care if her pain was that severe.

## 2019-06-18 NOTE — Progress Notes (Signed)

## 2019-07-02 MED FILL — NITROFURANTOIN MONO-MCR 100: 100 | 5 days supply | Qty: 10 | Fill #0

## 2019-07-02 MED FILL — FLUCONAZOLE 150 MG TABS: 150 | 2 days supply | Qty: 2 | Fill #0

## 2019-07-14 MED FILL — FLUCONAZOLE 150 MG TABS: 150 | 6 days supply | Qty: 3 | Fill #0

## 2019-07-14 MED FILL — SULFAMETHOXAZOLE-TMP DS TAB: 800-160 | 7 days supply | Qty: 14 | Fill #0

## 2019-07-20 ENCOUNTER — Other Ambulatory Visit (HOSPITAL_COMMUNITY): Payer: Self-pay | Admitting: Urology

## 2019-07-20 MED FILL — POTASSIUM CITRATE ER 15 MEQ: 15 MEQ | 30 days supply | Qty: 60 | Fill #0

## 2019-08-26 MED FILL — POTASSIUM CITRATE ER 15 MEQ: 15 MEQ | 30 days supply | Qty: 60 | Fill #1

## 2019-08-27 MED FILL — DICYCLOMINE 10 MG CAPSULE: 10 | 15 days supply | Qty: 90 | Fill #1

## 2019-08-28 ENCOUNTER — Ambulatory Visit: Payer: No Typology Code available for payment source | Attending: Internal Medicine

## 2019-08-28 DIAGNOSIS — Z23 Encounter for immunization: Secondary | ICD-10-CM

## 2019-08-28 NOTE — Progress Notes (Signed)
   Covid-19 Vaccination Clinic  Name:  Kimberly Riley    MRN: [id number] DOB: [birth year]  08/28/2019  [name] was observed post Covid-19 immunization for 15 minutes without incident. She was provided with Vaccine Information Sheet and instruction to access the V-Safe system.   [name] was instructed to call 911 with any severe reactions post vaccine: Marland Kitchen Difficulty breathing  . Swelling of face and throat  . A fast heartbeat  . A bad rash all over body  . Dizziness and weakness   Immunizations Administered    Name Date Dose VIS Date Route   Moderna COVID-19 Vaccine 08/28/2019 11:08 AM 0.5 mL 12/2018 Intramuscular   Manufacturer: Moderna   Lot: [id number]   NDC: 70962-836-62

## 2019-09-25 ENCOUNTER — Ambulatory Visit: Payer: No Typology Code available for payment source | Attending: Internal Medicine

## 2019-09-25 DIAGNOSIS — Z23 Encounter for immunization: Secondary | ICD-10-CM

## 2019-09-25 NOTE — Progress Notes (Signed)
   Covid-19 Vaccination Clinic  Name:  QUINCEE GITTENS    MRN: [id number] DOB: [birth year]  09/25/2019  Ms. Ebbs was observed post Covid-19 immunization for 15 minutes without incident. She was provided with Vaccine Information Sheet and instruction to access the V-Safe system.   Ms. Rynders was instructed to call 911 with any severe reactions post vaccine: Marland Kitchen Difficulty breathing  . Swelling of face and throat  . A fast heartbeat  . A bad rash all over body  . Dizziness and weakness   Immunizations Administered    Name Date Dose VIS Date Route   Moderna COVID-19 Vaccine 09/25/2019 11:58 AM 0.5 mL 12/2018 Intramuscular   Manufacturer: Moderna   Lot: [id number]   NDC: 23953-202-33

## 2019-10-12 MED FILL — POTASSIUM CITRATE ER 15 MEQ: 15 MEQ | 30 days supply | Qty: 60 | Fill #2

## 2019-10-12 MED FILL — DICYCLOMINE 10 MG CAPSULE: 10 | 15 days supply | Qty: 90 | Fill #2

## 2019-10-24 IMAGING — [facility] DIGITAL DIAGNOSTIC BILATERAL MAMMOGRAM WITH TOMO AND CAD
8 series · 8 of 24 positions shown · non-contrast
Comparison: Previous exam(s).

CLINICAL DATA: Short-term follow-up for a probably benign mass in
the right breast. This was initially evaluated on 06/17/2016 as a
call black from a baseline screening study.

EXAM:
DIGITAL DIAGNOSTIC BILATERAL MAMMOGRAM WITH CAD AND TOMO
ULTRASOUND RIGHT BREAST

[R CC synth-2D]
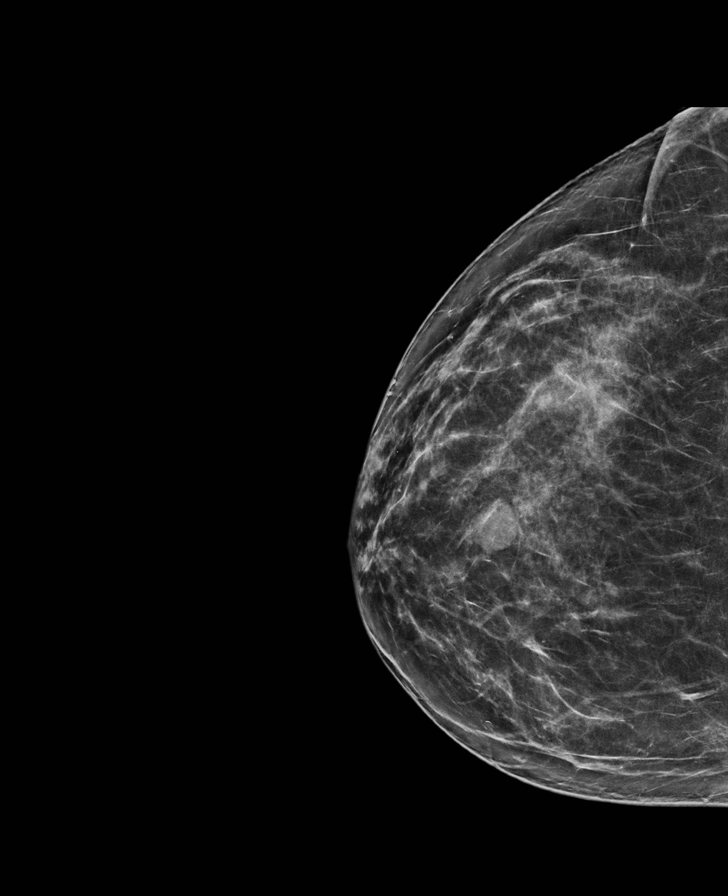

[L CC synth-2D]
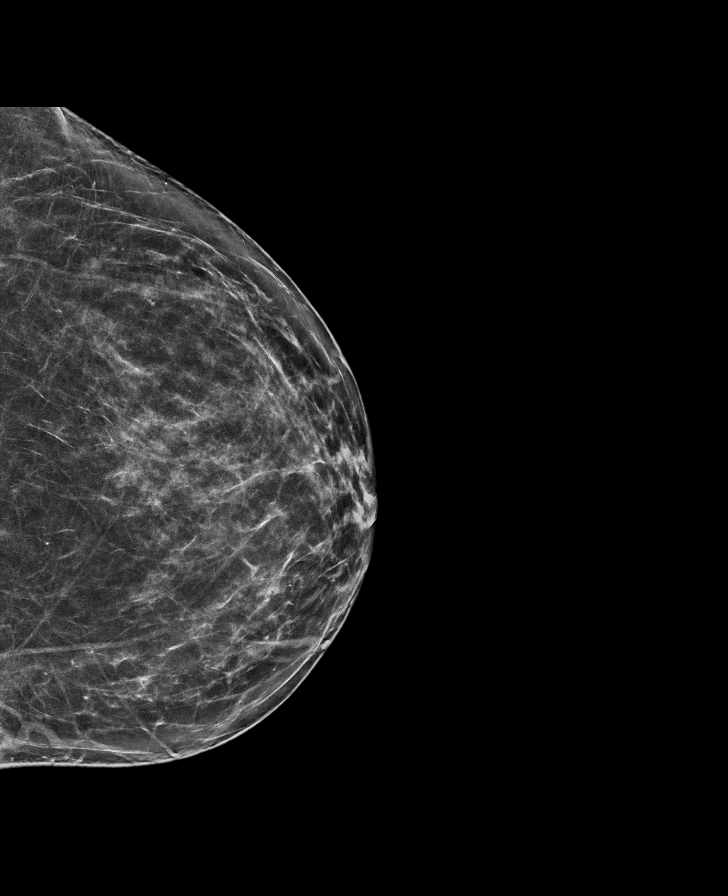

[L MLO synth-2D]
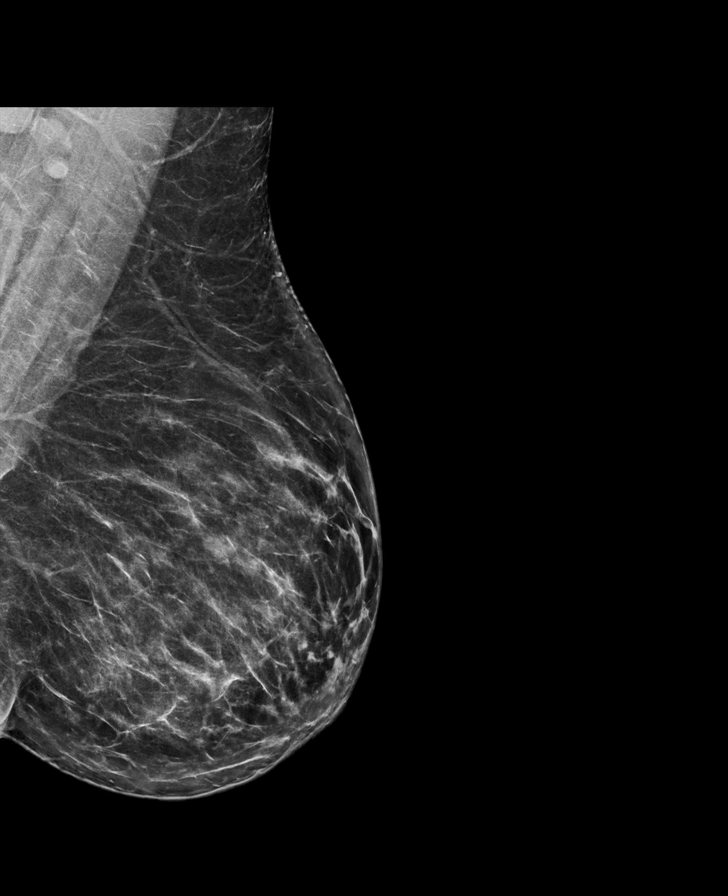

[R MLO synth-2D]
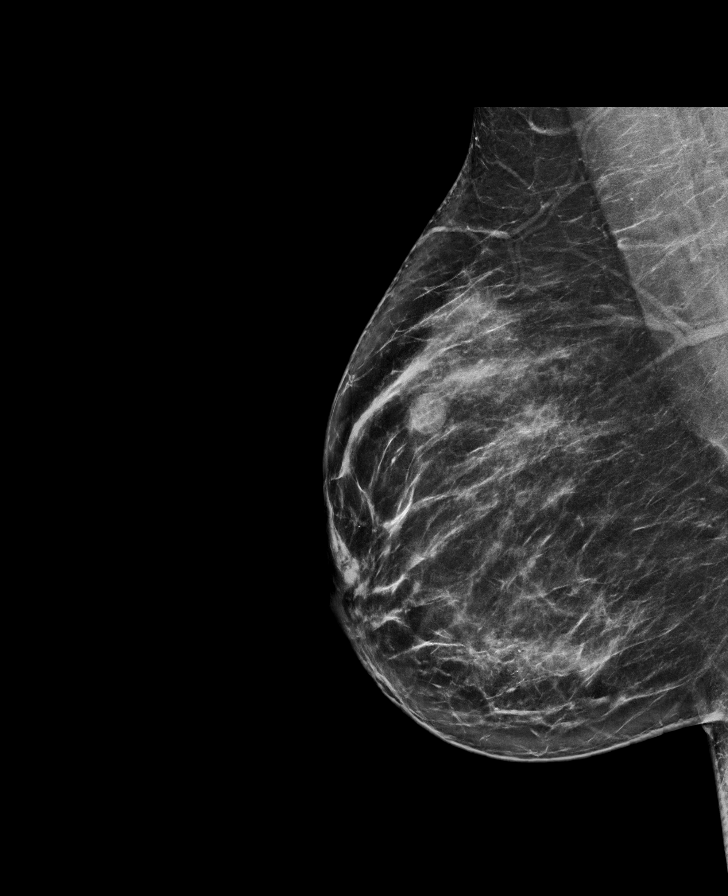

[L MLO tomo · tomo slice 43/85.0]
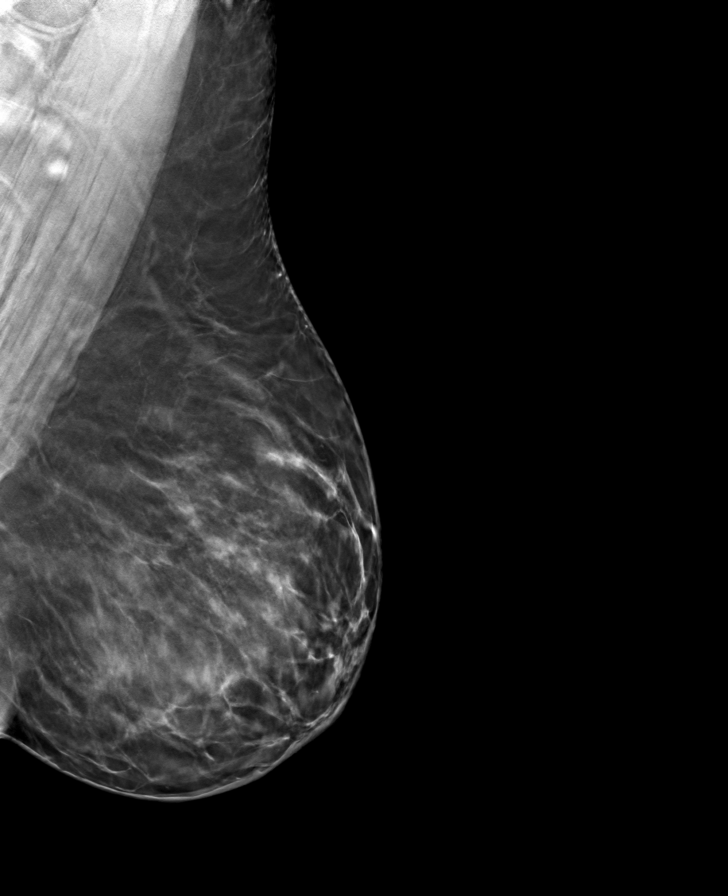

[R CC tomo · tomo slice 39/76.0]
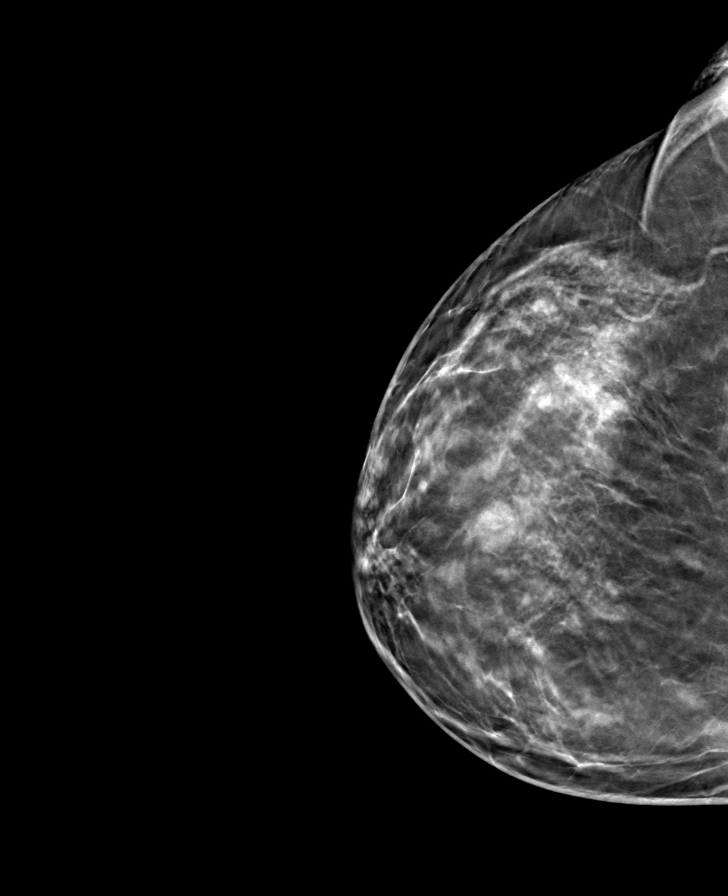

[L CC tomo · tomo slice 37/73.0]
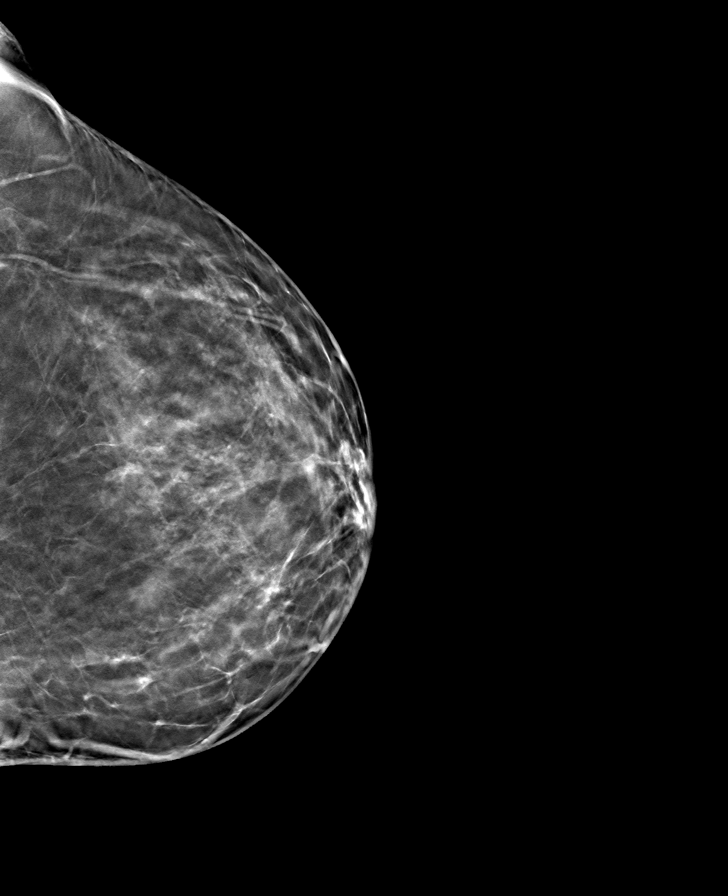

[R MLO tomo · tomo slice 42/83.0]
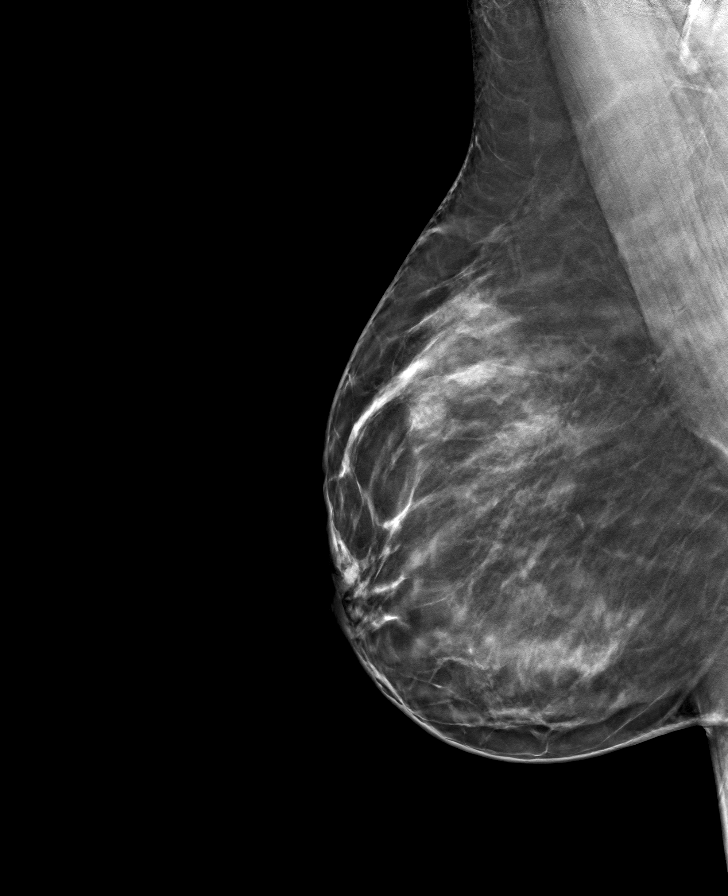

[8 of 24 positions shown; findings below may reference images not displayed]

ACR Breast Density Category b: There are scattered areas of
fibroglandular density.
FINDINGS: The circumscribed mass in the upper right breast is unchanged.

There are no new masses, no areas of architectural distortion, no
significant areas of asymmetry and no suspicious calcifications.

Mammographic images were processed with CAD.

Targeted ultrasound is performed, showing an oval circumscribed
hypoechoic mass in the right breast at 12 o'clock, 4 cm the nipple,
measuring 16 x 8 x 13 mm, unchanged from the prior studies.
IMPRESSION: 1. No evidence of breast malignancy.
2. Benign right breast mass consistent with a fibroadenoma, stable
for 2 years.

RECOMMENDATION:
Screening mammogram in one year.(Code:W7-0-X6D)

I have discussed the findings and recommendations with the patient.
Results were also provided in writing at the conclusion of the
visit. If applicable, a reminder letter will be sent to the patient
regarding the next appointment.

BI-RADS CATEGORY  2: Benign.

## 2019-10-29 ENCOUNTER — Other Ambulatory Visit (HOSPITAL_BASED_OUTPATIENT_CLINIC_OR_DEPARTMENT_OTHER): Payer: Self-pay | Admitting: Physician Assistant

## 2019-10-29 MED FILL — DICYCLOMINE 10 MG CAPSULE: 10 | 15 days supply | Qty: 90 | Fill #0

## 2019-10-29 MED FILL — FLUoxetine HCL 20 MG CAPS: 20 | 30 days supply | Qty: 30 | Fill #0

## 2019-11-01 ENCOUNTER — Other Ambulatory Visit (HOSPITAL_BASED_OUTPATIENT_CLINIC_OR_DEPARTMENT_OTHER): Payer: Self-pay | Admitting: Internal Medicine

## 2019-11-01 MED FILL — FLUARIX QUADRIVALENT 0.5 ML: 0.5 | 1 days supply | Qty: 1 | Fill #0

## 2019-11-22 MED FILL — DICYCLOMINE 10 MG CAPSULE: 10 | 15 days supply | Qty: 90 | Fill #3

## 2019-11-22 MED FILL — POTASSIUM CITRATE ER 15 MEQ: 15 MEQ | 30 days supply | Qty: 60 | Fill #3

## 2020-01-04 MED FILL — POTASSIUM CITRATE ER 15 MEQ: 15 MEQ | 30 days supply | Qty: 60 | Fill #4

## 2020-01-18 MED FILL — DICYCLOMINE 10 MG CAPSULE: 10 | 15 days supply | Qty: 90 | Fill #1

## 2020-02-18 MED FILL — POTASSIUM CITRATE ER 15 MEQ: 15 MEQ | 30 days supply | Qty: 60 | Fill #5

## 2020-03-08 MED FILL — DICYCLOMINE 10 MG CAPSULE: 10 | 15 days supply | Qty: 90 | Fill #4

## 2020-04-17 MED FILL — DICYCLOMINE 10 MG CAPSULE: 10 | 15 days supply | Qty: 90 | Fill #2

## 2020-04-17 MED FILL — POTASSIUM CITRATE ER 15 MEQ: 15 MEQ | 30 days supply | Qty: 60 | Fill #6

## 2020-05-16 ENCOUNTER — Other Ambulatory Visit (HOSPITAL_COMMUNITY): Payer: Self-pay

## 2020-05-16 MED ORDER — FLUTICASONE PROPIONATE 50 MCG/ACT NA SUSP
1.0000 | Freq: Every day | NASAL | 1 refills | Status: AC
  Filled 2020-05-16: qty 16, 60d supply, fill #0

## 2020-05-16 MED ORDER — AZITHROMYCIN 250 MG PO TABS
ORAL_TABLET | ORAL | 0 refills | Status: AC
  Filled 2020-05-16: qty 6, 5d supply, fill #0

## 2020-05-16 MED ORDER — FLUCONAZOLE 150 MG PO TABS
150.0000 mg | ORAL_TABLET | ORAL | 0 refills | Status: AC
  Filled 2020-05-16: qty 2, 6d supply, fill #0

## 2020-06-01 ENCOUNTER — Other Ambulatory Visit (HOSPITAL_BASED_OUTPATIENT_CLINIC_OR_DEPARTMENT_OTHER): Payer: Self-pay

## 2020-06-01 MED ORDER — DOXYCYCLINE HYCLATE 100 MG PO TABS
ORAL_TABLET | ORAL | 0 refills | Status: AC
  Filled 2020-06-01: qty 14, 7d supply, fill #0

## 2020-06-14 ENCOUNTER — Other Ambulatory Visit (HOSPITAL_BASED_OUTPATIENT_CLINIC_OR_DEPARTMENT_OTHER): Payer: Self-pay

## 2020-06-14 MED FILL — Dicyclomine HCl Cap 10 MG: ORAL | 15 days supply | Qty: 90 | Fill #0 | Status: AC

## 2020-06-14 MED FILL — Potassium Citrate Tab ER 15 MEQ (1620 MG): ORAL | 30 days supply | Qty: 60 | Fill #0 | Status: AC

## 2020-06-15 ENCOUNTER — Other Ambulatory Visit (HOSPITAL_BASED_OUTPATIENT_CLINIC_OR_DEPARTMENT_OTHER): Payer: Self-pay

## 2020-07-03 ENCOUNTER — Other Ambulatory Visit (HOSPITAL_BASED_OUTPATIENT_CLINIC_OR_DEPARTMENT_OTHER): Payer: Self-pay

## 2020-07-03 MED ORDER — SILVER SULFADIAZINE 1 % EX CREA
TOPICAL_CREAM | CUTANEOUS | 0 refills | Status: AC
  Filled 2020-07-03: qty 400, 90d supply, fill #0

## 2020-07-04 ENCOUNTER — Other Ambulatory Visit (HOSPITAL_BASED_OUTPATIENT_CLINIC_OR_DEPARTMENT_OTHER): Payer: Self-pay

## 2020-07-04 MED ORDER — METHOCARBAMOL 750 MG PO TABS
ORAL_TABLET | ORAL | 2 refills | Status: AC
  Filled 2020-07-04: qty 20, 20d supply, fill #0

## 2020-07-12 MED FILL — Potassium Citrate Tab ER 15 MEQ (1620 MG): ORAL | 30 days supply | Qty: 60 | Fill #1 | Status: AC

## 2020-07-13 ENCOUNTER — Other Ambulatory Visit (HOSPITAL_BASED_OUTPATIENT_CLINIC_OR_DEPARTMENT_OTHER): Payer: Self-pay

## 2020-08-13 ENCOUNTER — Other Ambulatory Visit (HOSPITAL_BASED_OUTPATIENT_CLINIC_OR_DEPARTMENT_OTHER): Payer: Self-pay

## 2020-08-14 ENCOUNTER — Other Ambulatory Visit (HOSPITAL_BASED_OUTPATIENT_CLINIC_OR_DEPARTMENT_OTHER): Payer: Self-pay

## 2020-08-15 ENCOUNTER — Other Ambulatory Visit (HOSPITAL_COMMUNITY): Payer: Self-pay

## 2020-08-15 ENCOUNTER — Other Ambulatory Visit (HOSPITAL_BASED_OUTPATIENT_CLINIC_OR_DEPARTMENT_OTHER): Payer: Self-pay

## 2020-08-15 MED ORDER — POTASSIUM CITRATE ER 15 MEQ (1620 MG) PO TBCR
1.0000 | EXTENDED_RELEASE_TABLET | Freq: Two times a day (BID) | ORAL | 11 refills | Status: AC
  Filled 2020-08-15 – 2020-09-01 (×2): qty 60, 30d supply, fill #0
  Filled 2020-10-26: qty 60, 30d supply, fill #1
  Filled 2020-11-01: qty 60, 30d supply, fill #0

## 2020-08-22 ENCOUNTER — Other Ambulatory Visit (HOSPITAL_BASED_OUTPATIENT_CLINIC_OR_DEPARTMENT_OTHER): Payer: Self-pay

## 2020-09-01 ENCOUNTER — Other Ambulatory Visit (HOSPITAL_BASED_OUTPATIENT_CLINIC_OR_DEPARTMENT_OTHER): Payer: Self-pay

## 2020-09-01 MED FILL — Dicyclomine HCl Cap 10 MG: ORAL | 15 days supply | Qty: 90 | Fill #1 | Status: AC

## 2020-09-15 ENCOUNTER — Other Ambulatory Visit (HOSPITAL_COMMUNITY): Payer: Self-pay

## 2020-09-15 ENCOUNTER — Other Ambulatory Visit (HOSPITAL_BASED_OUTPATIENT_CLINIC_OR_DEPARTMENT_OTHER): Payer: Self-pay

## 2020-09-15 MED ORDER — MELOXICAM 15 MG PO TABS
ORAL_TABLET | ORAL | 0 refills | Status: AC
  Filled 2020-09-15: qty 14, 14d supply, fill #0

## 2020-09-15 MED ORDER — FLUCONAZOLE 100 MG PO TABS
ORAL_TABLET | ORAL | 0 refills | Status: AC
  Filled 2020-09-15: qty 1, 1d supply, fill #0

## 2020-09-15 MED ORDER — SULFAMETHOXAZOLE-TRIMETHOPRIM 800-160 MG PO TABS
1.0000 | ORAL_TABLET | Freq: Two times a day (BID) | ORAL | 0 refills | Status: AC
  Filled 2020-09-15: qty 10, 5d supply, fill #0

## 2020-09-15 MED ORDER — SULFAMETHOXAZOLE-TRIMETHOPRIM 800-160 MG PO TABS
ORAL_TABLET | ORAL | 0 refills | Status: AC
  Filled 2020-09-15: qty 10, 5d supply, fill #0

## 2020-09-25 ENCOUNTER — Other Ambulatory Visit (HOSPITAL_BASED_OUTPATIENT_CLINIC_OR_DEPARTMENT_OTHER): Payer: Self-pay

## 2020-09-25 MED ORDER — DICYCLOMINE HCL 10 MG PO CAPS
ORAL_CAPSULE | ORAL | 5 refills | Status: AC
  Filled 2020-09-25: qty 90, 15d supply, fill #0

## 2020-09-25 MED ORDER — BUPROPION HCL ER (XL) 150 MG PO TB24
ORAL_TABLET | ORAL | 1 refills | Status: AC
  Filled 2020-09-25: qty 30, 30d supply, fill #0
  Filled 2020-11-07: qty 30, 30d supply, fill #1

## 2020-09-25 MED ORDER — BUSPIRONE HCL 5 MG PO TABS
ORAL_TABLET | ORAL | 1 refills | Status: AC
  Filled 2020-10-03: qty 60, 30d supply, fill #0

## 2020-09-26 ENCOUNTER — Other Ambulatory Visit (HOSPITAL_BASED_OUTPATIENT_CLINIC_OR_DEPARTMENT_OTHER): Payer: Self-pay

## 2020-10-03 ENCOUNTER — Other Ambulatory Visit (HOSPITAL_BASED_OUTPATIENT_CLINIC_OR_DEPARTMENT_OTHER): Payer: Self-pay

## 2020-10-05 ENCOUNTER — Other Ambulatory Visit (HOSPITAL_BASED_OUTPATIENT_CLINIC_OR_DEPARTMENT_OTHER): Payer: Self-pay

## 2020-10-26 ENCOUNTER — Other Ambulatory Visit (HOSPITAL_BASED_OUTPATIENT_CLINIC_OR_DEPARTMENT_OTHER): Payer: Self-pay

## 2020-10-26 MED ORDER — BUPROPION HCL ER (XL) 300 MG PO TB24
300.0000 mg | ORAL_TABLET | Freq: Every morning | ORAL | 1 refills | Status: DC
  Filled 2020-10-26: qty 30, 30d supply, fill #0
  Filled 2020-12-29: qty 30, 30d supply, fill #1

## 2020-10-26 MED ORDER — ALPRAZOLAM 0.5 MG PO TABS
0.5000 mg | ORAL_TABLET | Freq: Every day | ORAL | 0 refills | Status: AC
  Filled 2020-10-26: qty 20, 20d supply, fill #0

## 2020-10-27 ENCOUNTER — Other Ambulatory Visit (HOSPITAL_COMMUNITY): Payer: Self-pay

## 2020-11-01 ENCOUNTER — Other Ambulatory Visit (HOSPITAL_COMMUNITY): Payer: Self-pay

## 2020-11-01 ENCOUNTER — Other Ambulatory Visit (HOSPITAL_BASED_OUTPATIENT_CLINIC_OR_DEPARTMENT_OTHER): Payer: Self-pay

## 2020-11-07 ENCOUNTER — Other Ambulatory Visit (HOSPITAL_BASED_OUTPATIENT_CLINIC_OR_DEPARTMENT_OTHER): Payer: Self-pay

## 2020-11-16 ENCOUNTER — Other Ambulatory Visit (HOSPITAL_BASED_OUTPATIENT_CLINIC_OR_DEPARTMENT_OTHER): Payer: Self-pay

## 2020-11-16 MED ORDER — ESCITALOPRAM OXALATE 10 MG PO TABS
ORAL_TABLET | ORAL | 0 refills | Status: AC
  Filled 2020-11-16: qty 45, 90d supply, fill #0

## 2020-12-29 ENCOUNTER — Other Ambulatory Visit (HOSPITAL_BASED_OUTPATIENT_CLINIC_OR_DEPARTMENT_OTHER): Payer: Self-pay

## 2021-01-01 ENCOUNTER — Other Ambulatory Visit (HOSPITAL_BASED_OUTPATIENT_CLINIC_OR_DEPARTMENT_OTHER): Payer: Self-pay

## 2021-01-01 MED ORDER — BUPROPION HCL ER (XL) 300 MG PO TB24: ORAL_TABLET | ORAL | 0 refills | Status: AC

## 2021-01-08 ENCOUNTER — Other Ambulatory Visit (HOSPITAL_BASED_OUTPATIENT_CLINIC_OR_DEPARTMENT_OTHER): Payer: Self-pay

## 2021-01-25 ENCOUNTER — Other Ambulatory Visit (HOSPITAL_BASED_OUTPATIENT_CLINIC_OR_DEPARTMENT_OTHER): Payer: Self-pay

## 2021-01-31 ENCOUNTER — Other Ambulatory Visit (HOSPITAL_BASED_OUTPATIENT_CLINIC_OR_DEPARTMENT_OTHER): Payer: Self-pay

## 2021-10-04 ENCOUNTER — Other Ambulatory Visit (HOSPITAL_BASED_OUTPATIENT_CLINIC_OR_DEPARTMENT_OTHER): Payer: Self-pay

## 2021-10-04 MED ORDER — HYOSCYAMINE SULFATE ER 0.375 MG PO TB12
0.3750 mg | ORAL_TABLET | Freq: Two times a day (BID) | ORAL | 5 refills | Status: AC | PRN
  Filled 2021-10-04: qty 30, 15d supply, fill #0

## 2021-10-12 ENCOUNTER — Other Ambulatory Visit (HOSPITAL_BASED_OUTPATIENT_CLINIC_OR_DEPARTMENT_OTHER): Payer: Self-pay

## 2022-07-30 DIAGNOSIS — N200 Calculus of kidney: Secondary | ICD-10-CM | POA: Diagnosis not present

## 2023-06-03 DIAGNOSIS — R922 Inconclusive mammogram: Secondary | ICD-10-CM | POA: Diagnosis not present

## 2023-11-04 ENCOUNTER — Other Ambulatory Visit (HOSPITAL_BASED_OUTPATIENT_CLINIC_OR_DEPARTMENT_OTHER): Payer: Self-pay

## 2023-11-04 DIAGNOSIS — K589 Irritable bowel syndrome without diarrhea: Secondary | ICD-10-CM | POA: Diagnosis not present

## 2023-11-04 DIAGNOSIS — F321 Major depressive disorder, single episode, moderate: Secondary | ICD-10-CM | POA: Diagnosis not present

## 2023-11-04 DIAGNOSIS — Z13220 Encounter for screening for lipoid disorders: Secondary | ICD-10-CM | POA: Diagnosis not present

## 2023-11-04 DIAGNOSIS — Z0000 Encounter for general adult medical examination without abnormal findings: Secondary | ICD-10-CM | POA: Diagnosis not present

## 2023-11-04 DIAGNOSIS — R739 Hyperglycemia, unspecified: Secondary | ICD-10-CM | POA: Diagnosis not present

## 2023-11-04 DIAGNOSIS — Z8639 Personal history of other endocrine, nutritional and metabolic disease: Secondary | ICD-10-CM | POA: Diagnosis not present

## 2023-11-04 MED ORDER — SERTRALINE HCL 100 MG PO TABS
100.0000 mg | ORAL_TABLET | Freq: Every day | ORAL | 0 refills | Status: AC
  Filled 2023-11-04: qty 90, 90d supply, fill #0

## 2023-11-07 ENCOUNTER — Other Ambulatory Visit (HOSPITAL_BASED_OUTPATIENT_CLINIC_OR_DEPARTMENT_OTHER): Payer: Self-pay

## 2023-11-07 MED ORDER — VITAMIN D3 1.25 MG (50000 UT) PO CAPS
1.0000 | ORAL_CAPSULE | ORAL | 1 refills | Status: AC
  Filled 2023-11-07: qty 13, 90d supply, fill #0

## 2023-11-20 ENCOUNTER — Encounter (INDEPENDENT_AMBULATORY_CARE_PROVIDER_SITE_OTHER): Payer: Self-pay

## 2023-12-31 ENCOUNTER — Other Ambulatory Visit (HOSPITAL_BASED_OUTPATIENT_CLINIC_OR_DEPARTMENT_OTHER): Payer: Self-pay

## 2023-12-31 DIAGNOSIS — F321 Major depressive disorder, single episode, moderate: Secondary | ICD-10-CM | POA: Diagnosis not present

## 2023-12-31 DIAGNOSIS — F418 Other specified anxiety disorders: Secondary | ICD-10-CM | POA: Diagnosis not present

## 2023-12-31 DIAGNOSIS — K589 Irritable bowel syndrome without diarrhea: Secondary | ICD-10-CM | POA: Diagnosis not present

## 2023-12-31 DIAGNOSIS — Z8639 Personal history of other endocrine, nutritional and metabolic disease: Secondary | ICD-10-CM | POA: Diagnosis not present

## 2023-12-31 MED ORDER — ESCITALOPRAM OXALATE 10 MG PO TABS
10.0000 mg | ORAL_TABLET | Freq: Every day | ORAL | 1 refills | Status: AC
  Filled 2023-12-31: qty 30, 30d supply, fill #0

## 2023-12-31 MED ORDER — SERTRALINE HCL 50 MG PO TABS
ORAL_TABLET | ORAL | 0 refills | Status: AC
  Filled 2023-12-31: qty 21, 30d supply, fill #0

## 2024-01-02 ENCOUNTER — Encounter (INDEPENDENT_AMBULATORY_CARE_PROVIDER_SITE_OTHER): Payer: Self-pay

## 2024-02-09 ENCOUNTER — Other Ambulatory Visit (HOSPITAL_BASED_OUTPATIENT_CLINIC_OR_DEPARTMENT_OTHER): Payer: Self-pay

## 2024-02-09 MED ORDER — ESCITALOPRAM OXALATE 10 MG PO TABS
10.0000 mg | ORAL_TABLET | Freq: Every day | ORAL | 1 refills | Status: AC
  Filled 2024-02-09: qty 30, 30d supply, fill #0
